# Patient Record
Sex: Male | Born: 1960
Health system: Southern US, Community
[De-identification: ages and names within clinical notes are randomized; demographics above are authoritative.]

## PROBLEM LIST (undated history)

## (undated) DIAGNOSIS — E079 Disorder of thyroid, unspecified: Secondary | ICD-10-CM

## (undated) DIAGNOSIS — F909 Attention-deficit hyperactivity disorder, unspecified type: Secondary | ICD-10-CM

## (undated) DIAGNOSIS — K219 Gastro-esophageal reflux disease without esophagitis: Secondary | ICD-10-CM

## (undated) DIAGNOSIS — E039 Hypothyroidism, unspecified: Secondary | ICD-10-CM

## (undated) DIAGNOSIS — I10 Essential (primary) hypertension: Secondary | ICD-10-CM

## (undated) HISTORY — PX: TONSILLECTOMY: SUR1361

---

## 2004-03-26 ENCOUNTER — Encounter: Payer: Self-pay | Admitting: Family Medicine

## 2004-04-06 ENCOUNTER — Ambulatory Visit (HOSPITAL_COMMUNITY): Admission: RE | Admit: 2004-04-06 | Discharge: 2004-04-06 | Payer: Self-pay | Admitting: Cardiovascular Disease

## 2004-10-25 ENCOUNTER — Ambulatory Visit (HOSPITAL_COMMUNITY): Admission: RE | Admit: 2004-10-25 | Discharge: 2004-10-25 | Payer: Self-pay | Admitting: Cardiovascular Disease

## 2008-06-26 ENCOUNTER — Ambulatory Visit: Payer: Self-pay | Admitting: Family Medicine

## 2008-06-26 LAB — CONVERTED CEMR LAB
Bilirubin Urine: NEGATIVE
Blood in Urine, dipstick: NEGATIVE
Glucose, Urine, Semiquant: NEGATIVE
Ketones, urine, test strip: NEGATIVE
Nitrite: NEGATIVE
Protein, U semiquant: NEGATIVE
Specific Gravity, Urine: 1.01
Urobilinogen, UA: 0.2
WBC Urine, dipstick: NEGATIVE
pH: 7

## 2008-07-01 LAB — CONVERTED CEMR LAB
ALT: 21 units/L (ref 0–53)
AST: 27 units/L (ref 0–37)
Albumin: 4.4 g/dL (ref 3.5–5.2)
Alkaline Phosphatase: 30 units/L — ABNORMAL LOW (ref 39–117)
BUN: 8 mg/dL (ref 6–23)
Basophils Absolute: 0.1 10*3/uL (ref 0.0–0.1)
Basophils Relative: 1.1 % (ref 0.0–3.0)
Bilirubin, Direct: 0 mg/dL (ref 0.0–0.3)
CO2: 29 meq/L (ref 19–32)
Calcium: 9.5 mg/dL (ref 8.4–10.5)
Chloride: 105 meq/L (ref 96–112)
Cholesterol: 240 mg/dL — ABNORMAL HIGH (ref 0–200)
Creatinine, Ser: 0.9 mg/dL (ref 0.4–1.5)
Direct LDL: 141.7 mg/dL
Eosinophils Absolute: 0.3 10*3/uL (ref 0.0–0.7)
Eosinophils Relative: 3.6 % (ref 0.0–5.0)
GFR calc non Af Amer: 96.54 mL/min (ref >60)
Glucose, Bld: 81 mg/dL (ref 70–99)
HCT: 42.6 % (ref 39.0–52.0)
HDL: 54.2 mg/dL (ref >39.00)
Hemoglobin: 14.6 g/dL (ref 13.0–17.0)
Lymphocytes Relative: 31.7 % (ref 12.0–46.0)
Lymphs Abs: 2.3 10*3/uL (ref 0.7–4.0)
MCHC: 34.3 g/dL (ref 30.0–36.0)
MCV: 88.6 fL (ref 78.0–100.0)
Monocytes Absolute: 0.6 10*3/uL (ref 0.1–1.0)
Monocytes Relative: 7.7 % (ref 3.0–12.0)
Neutro Abs: 4.1 10*3/uL (ref 1.4–7.7)
Neutrophils Relative %: 55.9 % (ref 43.0–77.0)
Platelets: 151 10*3/uL (ref 150.0–400.0)
Potassium: 3.9 meq/L (ref 3.5–5.1)
RBC: 4.81 M/uL (ref 4.22–5.81)
RDW: 12 % (ref 11.5–14.6)
Sodium: 139 meq/L (ref 135–145)
TSH: 16.79 microintl units/mL — ABNORMAL HIGH (ref 0.35–5.50)
Total Bilirubin: 1.2 mg/dL (ref 0.3–1.2)
Total CHOL/HDL Ratio: 4
Total Protein: 7.2 g/dL (ref 6.0–8.3)
Triglycerides: 220 mg/dL — ABNORMAL HIGH (ref 0.0–149.0)
VLDL: 44 mg/dL — ABNORMAL HIGH (ref 0.0–40.0)
WBC: 7.4 10*3/uL (ref 4.5–10.5)

## 2008-07-02 ENCOUNTER — Ambulatory Visit: Payer: Self-pay | Admitting: Family Medicine

## 2008-07-02 DIAGNOSIS — E785 Hyperlipidemia, unspecified: Secondary | ICD-10-CM | POA: Insufficient documentation

## 2008-07-02 DIAGNOSIS — E039 Hypothyroidism, unspecified: Secondary | ICD-10-CM | POA: Insufficient documentation

## 2008-07-02 DIAGNOSIS — I10 Essential (primary) hypertension: Secondary | ICD-10-CM | POA: Insufficient documentation

## 2009-07-13 ENCOUNTER — Emergency Department (HOSPITAL_COMMUNITY): Admission: EM | Admit: 2009-07-13 | Discharge: 2009-07-14 | Payer: Self-pay | Admitting: Emergency Medicine

## 2009-07-28 ENCOUNTER — Encounter: Admission: RE | Admit: 2009-07-28 | Discharge: 2009-07-28 | Payer: Self-pay | Admitting: Otolaryngology

## 2009-07-28 ENCOUNTER — Encounter (INDEPENDENT_AMBULATORY_CARE_PROVIDER_SITE_OTHER): Payer: Self-pay | Admitting: *Deleted

## 2009-08-04 ENCOUNTER — Encounter (INDEPENDENT_AMBULATORY_CARE_PROVIDER_SITE_OTHER): Payer: Self-pay | Admitting: *Deleted

## 2009-09-01 ENCOUNTER — Ambulatory Visit: Payer: Self-pay | Admitting: Internal Medicine

## 2009-09-01 DIAGNOSIS — R1319 Other dysphagia: Secondary | ICD-10-CM | POA: Insufficient documentation

## 2009-09-01 DIAGNOSIS — R933 Abnormal findings on diagnostic imaging of other parts of digestive tract: Secondary | ICD-10-CM | POA: Insufficient documentation

## 2009-09-01 DIAGNOSIS — K219 Gastro-esophageal reflux disease without esophagitis: Secondary | ICD-10-CM | POA: Insufficient documentation

## 2009-09-08 ENCOUNTER — Ambulatory Visit: Payer: Self-pay | Admitting: Internal Medicine

## 2009-09-21 ENCOUNTER — Ambulatory Visit: Payer: Self-pay | Admitting: Family Medicine

## 2009-09-25 ENCOUNTER — Ambulatory Visit: Payer: Self-pay | Admitting: Family Medicine

## 2009-09-25 LAB — CONVERTED CEMR LAB
ALT: 18 units/L (ref 0–53)
AST: 24 units/L (ref 0–37)
Albumin: 4.5 g/dL (ref 3.5–5.2)
Alkaline Phosphatase: 38 units/L — ABNORMAL LOW (ref 39–117)
BUN: 14 mg/dL (ref 6–23)
Basophils Absolute: 0 10*3/uL (ref 0.0–0.1)
Basophils Relative: 0.6 % (ref 0.0–3.0)
Bilirubin Urine: NEGATIVE
Bilirubin, Direct: 0.2 mg/dL (ref 0.0–0.3)
Blood in Urine, dipstick: NEGATIVE
CO2: 30 meq/L (ref 19–32)
Calcium: 9.8 mg/dL (ref 8.4–10.5)
Chloride: 108 meq/L (ref 96–112)
Cholesterol: 227 mg/dL — ABNORMAL HIGH (ref 0–200)
Creatinine, Ser: 1 mg/dL (ref 0.4–1.5)
Direct LDL: 139.3 mg/dL
Eosinophils Absolute: 0.3 10*3/uL (ref 0.0–0.7)
Eosinophils Relative: 4.9 % (ref 0.0–5.0)
GFR calc non Af Amer: 86.01 mL/min (ref >60)
Glucose, Bld: 94 mg/dL (ref 70–99)
Glucose, Urine, Semiquant: NEGATIVE
HCT: 42 % (ref 39.0–52.0)
HDL: 69.7 mg/dL (ref >39.00)
Hemoglobin: 14.3 g/dL (ref 13.0–17.0)
Ketones, urine, test strip: NEGATIVE
Lymphocytes Relative: 42.3 % (ref 12.0–46.0)
Lymphs Abs: 2.3 10*3/uL (ref 0.7–4.0)
MCHC: 34 g/dL (ref 30.0–36.0)
MCV: 90.3 fL (ref 78.0–100.0)
Monocytes Absolute: 0.5 10*3/uL (ref 0.1–1.0)
Monocytes Relative: 8.5 % (ref 3.0–12.0)
Neutro Abs: 2.4 10*3/uL (ref 1.4–7.7)
Neutrophils Relative %: 43.7 % (ref 43.0–77.0)
Nitrite: NEGATIVE
Platelets: 163 10*3/uL (ref 150.0–400.0)
Potassium: 5.4 meq/L — ABNORMAL HIGH (ref 3.5–5.1)
Protein, U semiquant: NEGATIVE
RBC: 4.64 M/uL (ref 4.22–5.81)
RDW: 12.8 % (ref 11.5–14.6)
Sodium: 144 meq/L (ref 135–145)
Specific Gravity, Urine: 1.02
TSH: 3.8 microintl units/mL (ref 0.35–5.50)
Total Bilirubin: 1 mg/dL (ref 0.3–1.2)
Total CHOL/HDL Ratio: 3
Total Protein: 7.2 g/dL (ref 6.0–8.3)
Triglycerides: 125 mg/dL (ref 0.0–149.0)
Urobilinogen, UA: 0.2
VLDL: 25 mg/dL (ref 0.0–40.0)
WBC Urine, dipstick: NEGATIVE
WBC: 5.4 10*3/uL (ref 4.5–10.5)
pH: 6

## 2009-10-05 ENCOUNTER — Ambulatory Visit: Payer: Self-pay | Admitting: Family Medicine

## 2010-01-18 ENCOUNTER — Ambulatory Visit (HOSPITAL_COMMUNITY): Admission: RE | Admit: 2010-01-18 | Discharge: 2010-01-18 | Payer: Self-pay | Admitting: Family Medicine

## 2010-05-11 NOTE — Assessment & Plan Note (Signed)
Summary: swollen lymph nodes under both arms/cjr   Vital Signs:  Patient profile:   50 year old male Temp:     98.0 degrees F oral BP sitting:   150 / 100  (left arm) Cuff size:   regular  Vitals Entered By: Sid Falcon LPN (September 21, 2009 4:37 PM)   CC: swollen glands underarm   History of Present Illness: Patient noted about 3 weeks ago some bil axillary "swelling" or prominence. No nodes or distinct masses.  He has not noted any rash, fever, appetite change, weight loss, or any swollen nodes in neck or any other locations.   Recently quit smoking .  No cough or dyspnea.    Allergies (verified): No Known Drug Allergies  Past History:  Past Medical History: Last updated: 07/02/2008 Allergies Hyperlipidemia Hypertension Thyroid problems PMH reviewed for relevance  Review of Systems  The patient denies anorexia, fever, weight loss, hoarseness, chest pain, syncope, dyspnea on exertion, prolonged cough, and hemoptysis.    Physical Exam  General:  Well-developed,well-nourished,in no acute distress; alert,appropriate and cooperative throughout examination Head:  Normocephalic and atraumatic without obvious abnormalities. No apparent alopecia or balding. Ears:  External ear exam shows no significant lesions or deformities.  Otoscopic examination reveals clear canals, tympanic membranes are intact bilaterally without bulging, retraction, inflammation or discharge. Hearing is grossly normal bilaterally. Mouth:  Oral mucosa and oropharynx without lesions or exudates.  Teeth in good repair. Neck:  No deformities, masses, or tenderness noted. Lungs:  Normal respiratory effort, chest expands symmetrically. Lungs are clear to auscultation, no crackles or wheezes. Heart:  Normal rate and regular rhythm. S1 and S2 normal without gallop, murmur, click, rub or other extra sounds. Skin:  no rashes.   Cervical Nodes:  No lymphadenopathy noted Axillary Nodes:  none noted.  No swollen  glands.  No masses palpated.  Slight soft tissue prominence R vs L side.   Impression & Recommendations:  Problem # 1:  LOCALIZED SUPERFICIAL SWELLING MASS OR LUMP (ICD-782.2) no mass or node palpated.  Reassurance given.  Schedule CPE.  Complete Medication List: 1)  Levothyroxine Sodium 100 Mcg Tabs (Levothyroxine sodium) .Marland Kitchen.. 1 by mouth once daily 2)  Fluticasone Propionate 50 Mcg/act Susp (Fluticasone propionate) .Marland Kitchen.. 1-2 sprays per nostril once daily 3)  Dexilant 60 Mg Cpdr (Dexlansoprazole) .... Take 1 tablet by mouth once a day 4)  Bupropion Hcl 150 Mg Xr24h-tab (Bupropion hcl) .... Two tabs daily  Patient Instructions: 1)  Schedule CPE. Prescriptions: LEVOTHYROXINE SODIUM 100 MCG TABS (LEVOTHYROXINE SODIUM) 1 by mouth once daily  #90 x 0   Entered and Authorized by:   Evelena Peat MD   Signed by:   Evelena Peat MD on 09/21/2009   Method used:   [facility] to        [facility]  [address] [phone]       [address] [place], [place]  [address]       Ph: [phone] or [phone]       Fax: [phone]   RxID:   [id number]

## 2010-05-11 NOTE — Assessment & Plan Note (Signed)
Summary: Esophagitis--   History of Present Illness Visit Type: consult Primary GI MD: Yancey Flemings MD Primary Provider: Evelena Peat, M.D. 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 He presents today regarding reflux disease, dysphagia, and an abnormal barium esophagram. Patient tells me that he was a chronic smoker and user of alcohol. In early April he describes problems with hemoptysis, GERD, and dysphagia. He was seen by Dr. Jearld Fenton, ENT, and underwent a laryngoscopy without specific findings. As well, a barium esophagram on April 19 (reviewed). This revealed narrowing of the distal esophagus, possible esophagitis, and transient delay of a 13 mm barium tablet. He is now referred. He had been having intermittent solid food dysphagia as well as reflux symptoms. He was on b.i.d. omeprazole and for the past week once daily Dexilant. On PPI symptoms have improved significantly. He is anxious. He also describes muscle spasm type sensation in the right neck. As well as some transient lymph nodes and the chin and axillary region. His weight has been stable. He has discontinued smoking and alcohol use. GI review of systems is otherwise negative. No family history of gastrointestinal malignancy... His chronic medical problems are stable.   GI Review of Systems    Reports acid reflux, dysphagia with solids, and  heartburn.      Denies abdominal pain, belching, bloating, chest pain, dysphagia with liquids, loss of appetite, nausea, vomiting, vomiting blood, weight loss, and  weight gain.        Denies anal fissure, black tarry stools, change in bowel habit, constipation, diarrhea, diverticulosis, fecal incontinence, heme positive stool, hemorrhoids, irritable bowel syndrome, jaundice, light color stool, liver  problems, rectal bleeding, and  rectal pain. Preventive Screening-Counseling & Management  Alcohol-Tobacco     Smoking Status: quit      Drug Use:  no.      Current Medications (verified): 1)  Levothyroxine Sodium 100 Mcg Tabs (Levothyroxine Sodium) .Marland Kitchen.. 1 By Mouth Once Daily 2)  Fluticasone Propionate 50 Mcg/act Susp (Fluticasone Propionate) .Marland Kitchen.. 1-2 Sprays Per Nostril Once Daily 3)  Dexilant 60 Mg Cpdr (Dexlansoprazole) .... Take 1 Tablet By Mouth Once A Day  Allergies (verified): No Known Drug Allergies  Past History:  Past Medical History: Reviewed history from 07/02/2008 and no changes required. 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  The patient denies allergy/sinus, anemia, anxiety-new, arthritis/joint pain, back pain, blood in urine, breast changes/lumps, change in vision, confusion, cough, coughing up blood, depression-new, fainting, fatigue, fever, headaches-new, hearing problems, heart murmur, heart rhythm changes, itching, muscle pains/cramps, night sweats, nosebleeds, shortness of breath, skin rash, sleeping problems, swelling of feet/legs, thirst - excessive, urination - excessive, urination changes/pain, urine leakage, and voice change.    Vital Signs:  Patient profile:   50 year old male Height:      73 inches Weight:      186 pounds BMI:     24.63 Pulse rate:   80 / minute Pulse rhythm:   regular BP sitting:   140 /  86  (left arm) Cuff size:   regular  Vitals Entered By: Francee Piccolo CMA Duncan Dull) (Sep 01, 2009 2:33 PM)  Physical Exam  General:  Well developed, well nourished, no  acute distress. Head:  Normocephalic and atraumatic. Eyes:  PERRLA, no icterus. Ears:  Normal auditory acuity. Nose:  No deformity, discharge,  or lesions. Mouth:  No deformity or lesions, dentition normal. Neck:  Supple; no masses or thyromegaly. Chest Wall:  Symmetrical,  no deformities . Lungs:  Clear throughout to auscultation. Heart:  Regular rate and rhythm; no murmurs, rubs,  or bruits. Abdomen:  Soft, nontender and nondistended. No masses, hepatosplenomegaly or hernias noted. Normal bowel sounds. Msk:  Symmetrical with no gross deformities. Normal posture. Pulses:  Normal pulses noted. Extremities:  No clubbing, cyanosis, edema or deformities noted. Neurologic:  Alert and  oriented x4;  grossly normal neurologically. Skin:  Intact without significant lesions or rashes. Cervical Nodes:  no cervical or supraclavicular adenopathy Axillary Nodes:  No significant axillary adenopathy. Psych:  Alert and cooperative. Normal mood and affect.   Impression & Recommendations:  Problem # 1:  GERD (ICD-530.81) GERD with classic symptoms and problems with dysphagia. Improvement on PPI.  Plan: #1. Continue omeprazole 40 mg daily  #2. Reflux precautions #3. Literature on GERD provided for his review  Problem # 2:  DYSPHAGIA (ICD-787.29) intermittent dysphagia likely due to esophageal edema from esophagitis secondary to GERD. Possible underlying peptic stricture.  Plan: #1. Upper endoscopy to evaluate dysphagia. The nature of the procedure as well as the risks, benefits, and alternatives have been reviewed. He understood and agreed to proceed. #2. Consider esophageal dilation for significant stricture or dysphagia refractory to PPI therapy  Problem # 3:  ABNORMAL FINDINGS GI TRACT (ICD-793.4) changes on the esophagram consistent with esophagitis, subtle stricture. No evidence of cancer. Will clarify with upper endoscopy  Other Orders: EGD (EGD)  Patient Instructions: 1)  EGD [facility]  09/08/09 4:00 pm 2)  Upper Endoscopy brochure given.  3)  Copy sent to : Suzanna Obey, MD, Dr. Evelena Peat 4)  The medication list was reviewed and reconciled.  All changed / newly prescribed medications were explained.  A complete medication list was provided to the patient / caregiver. 5)  printed and given to pt. Milford Cage NCMA  Sep 01, 2009 3:17 PM

## 2010-05-11 NOTE — Letter (Signed)
Summary: New Patient letter  [facility]  [address] [place], [place] [address]   Phone: [phone]  Fax: [phone]       08/04/2009 MRN: [id number]  [facility] [address] [place], [place]  [address]  Dear Donald Osborne,  Welcome to the Gastroenterology Division at [facility].    You are scheduled to see Dr. Marina Goodell on 09-01-09 at 2:30p.m. on the 3rd floor at [facility], [address]  We ask that you try to arrive at our office 15 minutes prior to your appointment time to allow for check-in.  We would like you to complete the enclosed self-administered evaluation form prior to your visit and bring it with you on the day of your appointment.  We will review it with you.  Also, please bring a complete list of all your medications or, if you prefer, bring the medication bottles and we will list them.  Please bring your insurance card so that we may make a copy of it.  If your insurance requires a referral to see a specialist, please bring your referral form from your primary care physician.  Co-payments are due at the time of your visit and may be paid by cash, check or credit card.     Your office visit will consist of a consult with your physician (includes a physical exam), any laboratory testing he/she may order, scheduling of any necessary diagnostic testing (e.g. x-ray, ultrasound, CT-scan), and scheduling of a procedure (e.g. Endoscopy, Colonoscopy) if required.  Please allow enough time on your schedule to allow for any/all of these possibilities.    If you cannot keep your appointment, please call [phone] to cancel or reschedule prior to your appointment date.  This allows [place] the opportunity to schedule an appointment for another patient in need of care.  If you do not cancel or reschedule by 5 p.m. the business day prior to your appointment date, you will be charged a $50.00 late cancellation/no-show fee.    Thank you for choosing [facility] for your medical needs.  We appreciate the opportunity to care for you.  Please visit [place] at our website  to learn more about our practice.                     Sincerely,                                                             The Gastroenterology Division

## 2010-05-11 NOTE — Procedures (Signed)
Summary: Upper Endoscopy  Patient: Donald Osborne Note: All result statuses are Final unless otherwise noted.  Tests: (1) Upper Endoscopy (EGD)   EGD Upper Endoscopy       DONE     [facility]     [address]     [place], [place]  [address]           ENDOSCOPY PROCEDURE REPORT           PATIENT:  Donald Osborne, Donald Osborne  MR#:  [id number]     BIRTHDATE:  1960/04/26, 49 yrs. old  GENDER:  male           ENDOSCOPIST:  Daleon Willinger. Eda Keys, MD     Referred by:  Suzanna Obey, M.D.           PROCEDURE DATE:  09/08/2009     PROCEDURE:  EGD, diagnostic,     Maloney Dilation of Esophagus - 65     F     ASA CLASS:  Class II     INDICATIONS:  dysphagia, GERD, abnormal imaging           MEDICATIONS:   Fentanyl 100 mcg IV, Versed 10 mg IV     TOPICAL ANESTHETIC:  Exactacain Spray           DESCRIPTION OF PROCEDURE:   After the risks benefits and     alternatives of the procedure were thoroughly explained, informed     consent was obtained.  The LB GIF-H180 [id number] endoscope was     introduced through the mouth and advanced to the second portion of     the duodenum, without limitations.  The instrument was slowly     withdrawn as the mucosa was fully examined.     <<PROCEDUREIMAGES>>           The upper and middle third of the esophagus were carefully     inspected and no abnormalities were noted.there was a subtle     benign stricture at the GE junction. The z-line was well seen at     the GEJ.No Barrett's. The endoscope was pushed into the fundus     which was normal including a retroflexed view. The antrum,gastric     body, first and second part of the duodenum were unremarkable.     Retroflexed views revealed no abnormalities.    The scope was then     withdrawn from the patient and the procedure completed.           THERAPY: 56 F MALONEY DILATOR PASSED W/O RESITANCE OR HEME.     TOLERATED WELL           COMPLICATIONS:  None           ENDOSCOPIC IMPRESSION:     1) Subtle Stricture in  the distal esophagus - s/p Maloney     dilation 71F     2) Normal EGD otherwise     3) Gerd     RECOMMENDATIONS:     1) continue PPI daily     2) Anti-reflux regimen to be follow     3) OP follow-up in 6 weeks.           ______________________________     Wilhemina Bonito. Eda Keys, MD           CC:  Evelena Peat, MD, Suzanna Obey, MD, The Patient           n.     eSIGNED:  Wilhemina Bonito. Eda Keys at 09/08/2009 04:17 PM           Georgiann Cocker, [id number]  Note: An exclamation mark (!) indicates a result that was not dispersed into the flowsheet. Document Creation Date: 09/08/2009 4:17 PM _______________________________________________________________________  (1) Order result status: Final Collection or observation date-time: 09/08/2009 16:10 Requested date-time:  Receipt date-time:  Reported date-time:  Referring Physician:   Ordering Physician: Fransico Setters [id number]) Specimen Source:  Source: Launa Grill Order Number: [id number] Lab site:

## 2010-05-11 NOTE — Letter (Signed)
Summary: EGD Instructions  [facility]  [address] [place], [place] [address]   Phone: [phone]  Fax: [phone]       Donald Osborne    1960-07-27    MRN: [id number]       Procedure Day /Date:TUESDAY, 09/08/09     Arrival Time: 3:00 PM     Procedure Time:4:00 PM     [facility] of Procedure:                    [facility] (4th [facility]  PREPARATION FOR ENDOSCOPY   OnTUESDAY, 09/08/09 THE DAY OF THE PROCEDURE:  1.   No solid foods, milk or milk products are allowed after midnight the night before your procedure.  2.   Do not drink anything colored red or purple.  Avoid juices with pulp.  No orange juice.  3.  You may drink clear liquids until2:00 PM which is 2 hours before your procedure.                                                                                                CLEAR LIQUIDS INCLUDE: Water Jello Ice Popsicles Tea (sugar ok, no milk/cream) Powdered fruit flavored drinks Coffee (sugar ok, no milk/cream) Gatorade Juice: apple, Hartzell grape, Greenstein cranberry  Lemonade Clear bullion, consomm, broth Carbonated beverages (any kind) Strained chicken noodle soup Hard Candy   MEDICATION INSTRUCTIONS  Unless otherwise instructed, you should take regular prescription medications with a small sip of water as early as possible the morning of your procedure.             OTHER INSTRUCTIONS  You will need a responsible adult at least 48 years of age to accompany you and drive you home.   This person must remain in the waiting room during your procedure.  Wear loose fitting clothing that is easily removed.  Leave jewelry and other valuables at home.  However, you may wish to bring a book to read or an iPod/MP3 player to listen to music as you wait for your procedure to start.  Remove all body piercing jewelry and leave at home.  Total time from sign-in until discharge is approximately 2-3 hours.  You should go home directly  after your procedure and rest.  You can resume normal activities the day after your procedure.  The day of your procedure you should not:   Drive   Make legal decisions   Operate machinery   Drink alcohol   Return to work  You will receive specific instructions about eating, activities and medications before you leave.    The above instructions have been reviewed and explained to me by   _______________________    I fully understand and can verbalize these instructions _____________________________ Date _________

## 2010-05-11 NOTE — Assessment & Plan Note (Signed)
Summary: cpx/njr/Resched/cb   Vital Signs:  Patient profile:   50 year old male Height:      73 inches Weight:      184.5 pounds Temp:     98 degrees F oral Pulse rate:   80 / minute Pulse rhythm:   regular Resp:     12 per minute BP sitting:   130 / 84  (left arm) Cuff size:   regular  Vitals Entered By: Sid Falcon LPN (October 05, 2009 2:07 PM) CC: CPX   History of Present Illness: Here for CPE.  REcently quit smoking and ETOH use and has remained compliant. Starting to exercise.   Last tetanus > 10 years ago.  PMH, SH , AND FH reviewed.  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  Review of Systems  The patient denies anorexia, fever, weight loss, weight gain, chest pain, syncope, dyspnea on exertion, peripheral edema, prolonged cough, headaches, hemoptysis, abdominal pain, melena, hematochezia, severe indigestion/heartburn, incontinence, genital sores, muscle weakness, enlarged lymph nodes, and testicular masses.    Physical Exam  General:  Well-developed,well-nourished,in no acute distress; alert,appropriate and cooperative throughout examination Head:  Normocephalic and atraumatic without obvious  abnormalities. No apparent alopecia or balding. Eyes:  No corneal or conjunctival inflammation noted. EOMI. Perrla. Funduscopic exam benign, without hemorrhages, exudates or papilledema. Vision grossly normal. Ears:  External ear exam shows no significant lesions or deformities.  Otoscopic examination reveals clear canals, tympanic membranes are intact bilaterally without bulging, retraction, inflammation or discharge. Hearing is grossly normal bilaterally. Mouth:  Oral mucosa and oropharynx without lesions or exudates.  Teeth in good repair. Neck:  No deformities, masses, or tenderness noted. Lungs:  Normal respiratory effort, chest expands symmetrically. Lungs are clear to auscultation, no crackles or wheezes. Heart:  Normal rate and regular rhythm. S1 and S2 normal without gallop, murmur, click, rub or other extra sounds. Abdomen:  Bowel sounds positive,abdomen soft and non-tender without masses, organomegaly or hernias noted. Msk:  No deformity or scoliosis noted of thoracic or lumbar spine.   Extremities:  No clubbing, cyanosis, edema, or deformity noted with normal full range of motion of all joints.   Neurologic:  alert & oriented X3, cranial nerves II-XII intact, and strength normal in all extremities.   Skin:  Intact without suspicious lesions or rashes Cervical Nodes:  No lymphadenopathy noted Psych:  normally interactive, good eye contact, not anxious appearing, and not depressed appearing.     Impression & Recommendations:  Problem # 1:  PHYSICAL EXAMINATION (ICD-V70.0) Tdap given .  Labs reviewed with pt.  Discussed ongoing abstinence from ETOH and nicotine.  Complete Medication List: 1)  Levothyroxine Sodium 100 Mcg Tabs (Levothyroxine sodium) .Marland Kitchen.. 1 by mouth once daily 2)  Fluticasone Propionate 50 Mcg/act Susp (Fluticasone propionate) .Marland Kitchen.. 1-2 sprays per nostril once daily 3)  Dexilant 60 Mg Cpdr (Dexlansoprazole) .... Take  1 tablet by mouth once a day 4)  Bupropion Hcl 150  Mg Xr24h-tab (Bupropion hcl) .... Two tabs daily  Other Orders: Tdap => 91yrs IM (61607) Admin 1st Vaccine (37106)  Patient Instructions: 1)  It is important that you exercise reguarly at least 20 minutes 5 times a week. If you develop chest pain, have severe difficulty breathing, or feel very tired, stop exercising immediately and seek medical attention.  Prescriptions: FLUTICASONE PROPIONATE 50 MCG/ACT SUSP (FLUTICASONE PROPIONATE) 1-2 sprays per nostril once daily  #1 x 11   Entered and Authorized by:   Evelena Peat MD   Signed by:   Evelena Peat MD on 10/05/2009   Method used:   [facility]  [address] [phone][facility]ail[facility]       [address] [place], [place]  [address]       Ph: [phone] or [phone]       Fax: [phone]   RxID:   [id number]     Immunizations Administered:  Tetanus Vaccine:    Vaccine Type: Tdap    Site: left deltoid    Mfr: GlaxoSmithKline    Dose: 0.5 ml    Route: IM    Given by: Sid Falcon LPN    Exp. Date: 06/10/2011    Lot #: [id number]

## 2010-06-30 LAB — CBC
HCT: 39.4 % (ref 39.0–52.0)
Hemoglobin: 13.7 g/dL (ref 13.0–17.0)
MCHC: 34.8 g/dL (ref 30.0–36.0)
MCV: 89.7 fL (ref 78.0–100.0)
Platelets: 167 10*3/uL (ref 150–400)
RBC: 4.4 MIL/uL (ref 4.22–5.81)
RDW: 12.7 % (ref 11.5–15.5)
WBC: 6.2 10*3/uL (ref 4.0–10.5)

## 2010-06-30 LAB — BASIC METABOLIC PANEL
BUN: 12 mg/dL (ref 6–23)
CO2: 22 mEq/L (ref 19–32)
Calcium: 8.7 mg/dL (ref 8.4–10.5)
Chloride: 104 mEq/L (ref 96–112)
Creatinine, Ser: 1.09 mg/dL (ref 0.4–1.5)
GFR calc Af Amer: >60 mL/min (ref >60)
GFR calc non Af Amer: >60 mL/min (ref >60)
Glucose, Bld: 115 mg/dL — ABNORMAL HIGH (ref 70–99)
Potassium: 3.5 mEq/L (ref 3.5–5.1)
Sodium: 134 mEq/L — ABNORMAL LOW (ref 135–145)

## 2010-06-30 LAB — DIFFERENTIAL
Basophils Absolute: 0 10*3/uL (ref 0.0–0.1)
Basophils Relative: 1 % (ref 0–1)
Eosinophils Absolute: 0.2 10*3/uL (ref 0.0–0.7)
Eosinophils Relative: 4 % (ref 0–5)
Lymphocytes Relative: 39 % (ref 12–46)
Lymphs Abs: 2.4 10*3/uL (ref 0.7–4.0)
Monocytes Absolute: 0.6 10*3/uL (ref 0.1–1.0)
Monocytes Relative: 10 % (ref 3–12)
Neutro Abs: 2.9 10*3/uL (ref 1.7–7.7)
Neutrophils Relative %: 47 % (ref 43–77)

## 2012-09-07 ENCOUNTER — Other Ambulatory Visit: Payer: Self-pay

## 2012-09-07 DIAGNOSIS — R140 Abdominal distension (gaseous): Secondary | ICD-10-CM

## 2012-09-11 ENCOUNTER — Ambulatory Visit
Admission: RE | Admit: 2012-09-11 | Discharge: 2012-09-11 | Disposition: A | Discharge status: Home / Self Care | Payer: BC Managed Care – PPO | Source: Ambulatory Visit

## 2012-09-11 DIAGNOSIS — R140 Abdominal distension (gaseous): Secondary | ICD-10-CM

## 2015-01-02 ENCOUNTER — Other Ambulatory Visit: Payer: Self-pay | Admitting: Physician Assistant

## 2015-01-02 DIAGNOSIS — R109 Unspecified abdominal pain: Secondary | ICD-10-CM

## 2015-01-07 ENCOUNTER — Other Ambulatory Visit: Payer: Self-pay

## 2015-01-16 ENCOUNTER — Ambulatory Visit
Admission: RE | Admit: 2015-01-16 | Discharge: 2015-01-16 | Disposition: A | Discharge status: Home / Self Care | Payer: BLUE CROSS/BLUE SHIELD | Source: Ambulatory Visit | Attending: Physician Assistant | Admitting: Physician Assistant

## 2015-01-16 DIAGNOSIS — R109 Unspecified abdominal pain: Secondary | ICD-10-CM

## 2016-01-18 DIAGNOSIS — L719 Rosacea, unspecified: Secondary | ICD-10-CM | POA: Diagnosis not present

## 2016-01-18 DIAGNOSIS — Z23 Encounter for immunization: Secondary | ICD-10-CM | POA: Diagnosis not present

## 2016-04-07 DIAGNOSIS — N529 Male erectile dysfunction, unspecified: Secondary | ICD-10-CM | POA: Diagnosis not present

## 2016-04-07 DIAGNOSIS — E039 Hypothyroidism, unspecified: Secondary | ICD-10-CM | POA: Diagnosis not present

## 2016-04-07 DIAGNOSIS — F900 Attention-deficit hyperactivity disorder, predominantly inattentive type: Secondary | ICD-10-CM | POA: Diagnosis not present

## 2016-05-11 DIAGNOSIS — L719 Rosacea, unspecified: Secondary | ICD-10-CM | POA: Diagnosis not present

## 2016-10-28 DIAGNOSIS — K219 Gastro-esophageal reflux disease without esophagitis: Secondary | ICD-10-CM | POA: Diagnosis not present

## 2016-10-28 DIAGNOSIS — F900 Attention-deficit hyperactivity disorder, predominantly inattentive type: Secondary | ICD-10-CM | POA: Diagnosis not present

## 2016-10-28 DIAGNOSIS — E039 Hypothyroidism, unspecified: Secondary | ICD-10-CM | POA: Diagnosis not present

## 2016-12-29 ENCOUNTER — Encounter: Payer: Self-pay | Admitting: Family Medicine

## 2017-01-11 DIAGNOSIS — Z23 Encounter for immunization: Secondary | ICD-10-CM | POA: Diagnosis not present

## 2017-03-20 ENCOUNTER — Encounter (HOSPITAL_COMMUNITY): Payer: Self-pay | Admitting: Physician Assistant

## 2017-03-20 ENCOUNTER — Other Ambulatory Visit: Payer: Self-pay

## 2017-03-20 ENCOUNTER — Emergency Department (HOSPITAL_COMMUNITY)
Admission: EM | Admit: 2017-03-20 | Discharge: 2017-03-20 | Disposition: A | Discharge status: Home / Self Care | Payer: BLUE CROSS/BLUE SHIELD | Attending: Emergency Medicine | Admitting: Emergency Medicine

## 2017-03-20 DIAGNOSIS — E039 Hypothyroidism, unspecified: Secondary | ICD-10-CM | POA: Insufficient documentation

## 2017-03-20 DIAGNOSIS — R1084 Generalized abdominal pain: Secondary | ICD-10-CM

## 2017-03-20 DIAGNOSIS — I10 Essential (primary) hypertension: Secondary | ICD-10-CM | POA: Diagnosis not present

## 2017-03-20 DIAGNOSIS — R1033 Periumbilical pain: Secondary | ICD-10-CM | POA: Diagnosis not present

## 2017-03-20 LAB — LIPASE, BLOOD: LIPASE: 48 U/L (ref 11–51)

## 2017-03-20 LAB — COMPREHENSIVE METABOLIC PANEL
ALBUMIN: 4.5 g/dL (ref 3.5–5.0)
ALT: 21 U/L (ref 17–63)
AST: 28 U/L (ref 15–41)
Alkaline Phosphatase: 39 U/L (ref 38–126)
Anion gap: 10 (ref 5–15)
BUN: 7 mg/dL (ref 6–20)
CHLORIDE: 100 mmol/L — AB (ref 101–111)
CO2: 25 mmol/L (ref 22–32)
CREATININE: 1.02 mg/dL (ref 0.61–1.24)
Calcium: 9.7 mg/dL (ref 8.9–10.3)
GFR calc non Af Amer: >60 mL/min (ref >60)
Glucose, Bld: 119 mg/dL — ABNORMAL HIGH (ref 65–99)
Potassium: 4.8 mmol/L (ref 3.5–5.1)
SODIUM: 135 mmol/L (ref 135–145)
Total Bilirubin: 1 mg/dL (ref 0.3–1.2)
Total Protein: 7.2 g/dL (ref 6.5–8.1)

## 2017-03-20 LAB — URINALYSIS, ROUTINE W REFLEX MICROSCOPIC
Bilirubin Urine: NEGATIVE
GLUCOSE, UA: NEGATIVE mg/dL
HGB URINE DIPSTICK: NEGATIVE
Ketones, ur: NEGATIVE mg/dL
Leukocytes, UA: NEGATIVE
Nitrite: NEGATIVE
PROTEIN: NEGATIVE mg/dL
Specific Gravity, Urine: 1.005 (ref 1.005–1.030)
pH: 7 (ref 5.0–8.0)

## 2017-03-20 LAB — CBC
HCT: 45.4 % (ref 39.0–52.0)
Hemoglobin: 15.7 g/dL (ref 13.0–17.0)
MCH: 31 pg (ref 26.0–34.0)
MCHC: 34.6 g/dL (ref 30.0–36.0)
MCV: 89.5 fL (ref 78.0–100.0)
PLATELETS: 210 10*3/uL (ref 150–400)
RBC: 5.07 MIL/uL (ref 4.22–5.81)
RDW: 12.8 % (ref 11.5–15.5)
WBC: 7.1 10*3/uL (ref 4.0–10.5)

## 2017-03-20 MED ORDER — SUCRALFATE 1 G PO TABS: 1.0000 g | ORAL_TABLET | Freq: Three times a day (TID) | ORAL | 0 refills | Status: DC

## 2017-03-20 MED ORDER — RANITIDINE HCL 150 MG PO TABS: 150.0000 mg | ORAL_TABLET | Freq: Two times a day (BID) | ORAL | 0 refills | Status: DC

## 2017-03-20 NOTE — Discharge Instructions (Signed)
1. Medications: Start taking zantac and carafate. Stop prilosec.  2. Treatment: rest, drink plenty of fluids, advance diet slowly and eat bland foods that will not upset your stomach. Elevate the head of the bed.  3. Follow Up: Please followup with your primary doctor in 5-7 days for discussion of your diagnoses and further evaluation after today's visit; Please return to the ER for persistent vomiting, high fevers or worsening symptoms

## 2017-03-20 NOTE — ED Provider Notes (Signed)
[facility]   CSN: [id number] Arrival date & time: 03/20/17  1231     History   Chief Complaint Chief Complaint  Patient presents with  . Abdominal Pain    HPI Georgiann CockerJohn Mesquita is a 56 y.o. male with history of GERD, hypothyroidism, HLD, HTN presents today with chief complaint acute onset intermittent abdominal pain for 1 week.  He notes a mild dull burning sensation to the periumbilical region with radiation to the right side of the upper abdomen for the past week which comes on after meals.  He also notes a sensation of bloating.  Denies fevers, chills, chest pain, shortness of breath, nausea, vomiting, diarrhea, constipation, melena, or hematochezia.  He denies urinary symptoms.  No known sick contacts.  He notes that he is a former smoker and has cut down alcohol consumption significantly drinking socially a few times a month.  Notes he has a history of GERD for which he takes Prilosec which has been somewhat helpful for his symptoms. He had an appointment with his PCP today but it was canceled due to inclement weather.   The history is provided by the patient.    History reviewed. No pertinent past medical history.  Patient Active Problem List   Diagnosis Date Noted  . GERD 09/01/2009  . DYSPHAGIA 09/01/2009  . ABNORMAL FINDINGS GI TRACT 09/01/2009  . HYPOTHYROIDISM 07/02/2008  . HYPERLIPIDEMIA 07/02/2008  . HYPERTENSION 07/02/2008      Home Medications    Prior to Admission medications   Medication Sig Start Date End Date Taking? Authorizing Provider  ranitidine (ZANTAC) 150 MG tablet Take 1 tablet (150 mg total) by mouth 2 (two) times daily. 03/20/17   Luevenia MaxinFawze, Merranda Bolls A, PA-C  sucralfate (CARAFATE) 1 g tablet Take 1 tablet (1 g total) by mouth 4 (four) times daily -  with meals and at bedtime for 14 days. 03/20/17 04/03/17  Jeanie SewerFawze, Shawniece Oyola A, PA-C    Family History No family history on file.  Social History Social History     Tobacco Use  . Smoking status: Not on file  Substance Use Topics  . Alcohol use: Not on file  . Drug use: Not on file     Allergies   Patient has no known allergies.   Review of Systems Review of Systems  Constitutional: Negative for chills and fever.  Respiratory: Negative for shortness of breath.   Cardiovascular: Negative for chest pain.  Gastrointestinal: Positive for abdominal pain. Negative for blood in stool, constipation, diarrhea, nausea, rectal pain and vomiting.  Genitourinary: Negative for dysuria and hematuria.  All other systems reviewed and are negative.    Physical Exam Updated Vital Signs BP (!) 153/110 (BP Location: Right Arm)   Pulse 78   Temp 97.8 F (36.6 C) (Oral)   Resp 16   SpO2 98%   Physical Exam  Constitutional: He appears well-developed and well-nourished. No distress.  HENT:  Head: Normocephalic and atraumatic.  Eyes: Conjunctivae and EOM are normal. Pupils are equal, round, and reactive to light. Right eye exhibits no discharge. Left eye exhibits no discharge.  Neck: No JVD present. No tracheal deviation present.  Cardiovascular: Normal rate, regular rhythm and normal heart sounds.  Pulmonary/Chest: Effort normal and breath sounds normal.  Abdominal: Soft. Normal appearance and bowel sounds are normal. He exhibits no distension, no pulsatile liver, no fluid wave, no ascites, no pulsatile midline mass and no mass. There is no tenderness. There is no rigidity, no  rebound, no guarding, no CVA tenderness, no tenderness at McBurney's point and negative Murphy's sign.  Musculoskeletal: He exhibits no edema.  Neurological: He is alert.  Skin: Skin is warm and dry. No erythema.  Psychiatric: He has a normal mood and affect. His behavior is normal.  Nursing note and vitals reviewed.    ED Treatments / Results  Labs (all labs ordered are listed, but only abnormal results are displayed) Labs Reviewed  COMPREHENSIVE METABOLIC PANEL -  Abnormal; Notable for the following components:      Result Value   Chloride 100 (*)    Glucose, Bld 119 (*)    All other components within normal limits  LIPASE, BLOOD  CBC  URINALYSIS, ROUTINE W REFLEX MICROSCOPIC    EKG  EKG Interpretation None       Radiology No results found.  Procedures Procedures (including critical care time)  Medications Ordered in ED Medications - No data to display   Initial Impression / Assessment and Plan / ED Course  I have reviewed the triage vital signs and the nursing notes.  Pertinent labs & imaging results that were available during my care of the patient were reviewed by me and considered in my medical decision making (see chart for details).     Patient presents with burning abdominal pain after meals.  Afebrile, vital signs are stable (states Bouley coat hypertension) and he is nontoxic in appearance.  Abdominal examination is unremarkable.  Lab work is unremarkable.  No evidence of UTI or nephrolithiasis.  No leukocytosis.  Doubt obstruction, perforation, appendicitis, mesenteric ischemia, or other acute surgical abdominal pathology. Suspect worsening of his GERD or food sensitivity such as lactose intolerance or gluten intolerance.   He is tolerating p.o. food and fluids without difficulty.  He will follow-up with primary care physician for reevaluation.  Discussed lifestyle modification such as avoiding alcohol and elevating the head of the bed.  He will also follow-up with his primary care physician for blood pressure recheck.  Will start Carafate and Zantac and discontinue Prilosec.  Discussed indications for return to the ED. Pt verbalized understanding of and agreement with plan and is safe for discharge home at this time.   Final Clinical Impressions(s) / ED Diagnoses   Final diagnoses:  Generalized abdominal pain    ED Discharge Orders        Ordered    ranitidine (ZANTAC) 150 MG tablet  2 times daily     03/20/17 1403     sucralfate (CARAFATE) 1 g tablet  3 times daily with meals & bedtime     03/20/17 1403       Jeanie SewerFawze, Leonora Gores A, PA-C 03/20/17 1409    Benjiman CorePickering, Nathan, MD 03/20/17 1529

## 2017-03-20 NOTE — ED Notes (Signed)
EDP at bedside  

## 2017-03-20 NOTE — ED Triage Notes (Addendum)
Pt reports mid abd pain for 1 week with bloating and feels like he has fluid in his abdomen. Worse after eating. Pt denies any n/v/d.

## 2017-03-20 NOTE — ED Notes (Signed)
Patient given discharge instructions and verbalized understanding.  Patient stable to discharge at this time.  Patient is alert and oriented to baseline.  No distressed noted at this time.  All belongings taken with the patient at discharge.   

## 2017-03-23 DIAGNOSIS — R140 Abdominal distension (gaseous): Secondary | ICD-10-CM | POA: Diagnosis not present

## 2017-03-23 DIAGNOSIS — E039 Hypothyroidism, unspecified: Secondary | ICD-10-CM | POA: Diagnosis not present

## 2017-03-28 ENCOUNTER — Other Ambulatory Visit: Payer: Self-pay | Admitting: Family Medicine

## 2017-03-28 DIAGNOSIS — R140 Abdominal distension (gaseous): Secondary | ICD-10-CM

## 2017-03-29 DIAGNOSIS — K219 Gastro-esophageal reflux disease without esophagitis: Secondary | ICD-10-CM | POA: Diagnosis not present

## 2017-03-29 DIAGNOSIS — R1011 Right upper quadrant pain: Secondary | ICD-10-CM | POA: Diagnosis not present

## 2017-03-29 DIAGNOSIS — F1021 Alcohol dependence, in remission: Secondary | ICD-10-CM | POA: Diagnosis not present

## 2017-03-30 ENCOUNTER — Ambulatory Visit
Admission: RE | Admit: 2017-03-30 | Discharge: 2017-03-30 | Disposition: A | Discharge status: Home / Self Care | Payer: BLUE CROSS/BLUE SHIELD | Source: Ambulatory Visit | Attending: Family Medicine | Admitting: Family Medicine

## 2017-03-30 DIAGNOSIS — R1010 Upper abdominal pain, unspecified: Secondary | ICD-10-CM | POA: Diagnosis not present

## 2017-03-30 DIAGNOSIS — R140 Abdominal distension (gaseous): Secondary | ICD-10-CM

## 2017-04-07 ENCOUNTER — Other Ambulatory Visit: Payer: Self-pay | Admitting: Family Medicine

## 2017-04-10 ENCOUNTER — Other Ambulatory Visit: Payer: Self-pay | Admitting: Family Medicine

## 2017-04-10 DIAGNOSIS — K838 Other specified diseases of biliary tract: Secondary | ICD-10-CM

## 2017-04-13 DIAGNOSIS — R202 Paresthesia of skin: Secondary | ICD-10-CM | POA: Diagnosis not present

## 2017-04-13 DIAGNOSIS — A510 Primary genital syphilis: Secondary | ICD-10-CM | POA: Diagnosis not present

## 2017-04-16 ENCOUNTER — Other Ambulatory Visit: Payer: BLUE CROSS/BLUE SHIELD

## 2017-04-23 ENCOUNTER — Inpatient Hospital Stay
Admission: RE | Admit: 2017-04-23 | Discharge: 2017-04-23 | Disposition: A | Discharge status: Home / Self Care | Payer: BLUE CROSS/BLUE SHIELD | Source: Ambulatory Visit | Attending: Family Medicine | Admitting: Family Medicine

## 2017-04-29 ENCOUNTER — Ambulatory Visit
Admission: RE | Admit: 2017-04-29 | Discharge: 2017-04-29 | Disposition: A | Discharge status: Home / Self Care | Payer: BLUE CROSS/BLUE SHIELD | Source: Ambulatory Visit | Attending: Family Medicine | Admitting: Family Medicine

## 2017-04-29 DIAGNOSIS — D1803 Hemangioma of intra-abdominal structures: Secondary | ICD-10-CM | POA: Diagnosis not present

## 2017-04-29 DIAGNOSIS — K838 Other specified diseases of biliary tract: Secondary | ICD-10-CM

## 2017-04-29 MED ORDER — GADOBENATE DIMEGLUMINE 529 MG/ML IV SOLN
15.0000 mL | Freq: Once | INTRAVENOUS | Status: AC | PRN
  Administered 2017-04-29: 15 mL via INTRAVENOUS

## 2017-05-10 DIAGNOSIS — K210 Gastro-esophageal reflux disease with esophagitis: Secondary | ICD-10-CM | POA: Diagnosis not present

## 2017-05-10 DIAGNOSIS — K449 Diaphragmatic hernia without obstruction or gangrene: Secondary | ICD-10-CM | POA: Diagnosis not present

## 2017-05-10 DIAGNOSIS — K219 Gastro-esophageal reflux disease without esophagitis: Secondary | ICD-10-CM | POA: Diagnosis not present

## 2017-05-16 DIAGNOSIS — K210 Gastro-esophageal reflux disease with esophagitis: Secondary | ICD-10-CM | POA: Diagnosis not present

## 2017-06-20 DIAGNOSIS — M5430 Sciatica, unspecified side: Secondary | ICD-10-CM | POA: Diagnosis not present

## 2017-07-19 DIAGNOSIS — M545 Low back pain: Secondary | ICD-10-CM | POA: Diagnosis not present

## 2017-07-19 DIAGNOSIS — M5441 Lumbago with sciatica, right side: Secondary | ICD-10-CM | POA: Diagnosis not present

## 2017-07-25 DIAGNOSIS — M545 Low back pain: Secondary | ICD-10-CM | POA: Diagnosis not present

## 2017-07-25 DIAGNOSIS — M5416 Radiculopathy, lumbar region: Secondary | ICD-10-CM | POA: Diagnosis not present

## 2017-08-01 DIAGNOSIS — M5416 Radiculopathy, lumbar region: Secondary | ICD-10-CM | POA: Diagnosis not present

## 2017-08-08 DIAGNOSIS — M5416 Radiculopathy, lumbar region: Secondary | ICD-10-CM | POA: Diagnosis not present

## 2017-08-15 DIAGNOSIS — M5416 Radiculopathy, lumbar region: Secondary | ICD-10-CM | POA: Diagnosis not present

## 2017-09-26 DIAGNOSIS — K648 Other hemorrhoids: Secondary | ICD-10-CM | POA: Diagnosis not present

## 2017-09-27 ENCOUNTER — Emergency Department (HOSPITAL_COMMUNITY)
Admission: EM | Admit: 2017-09-27 | Discharge: 2017-09-27 | Disposition: A | Discharge status: Home / Self Care | Payer: BLUE CROSS/BLUE SHIELD | Source: Home / Self Care

## 2017-09-27 ENCOUNTER — Emergency Department (HOSPITAL_COMMUNITY): Payer: BLUE CROSS/BLUE SHIELD

## 2017-09-27 ENCOUNTER — Other Ambulatory Visit: Payer: Self-pay

## 2017-09-27 ENCOUNTER — Encounter (HOSPITAL_COMMUNITY): Payer: Self-pay | Admitting: Emergency Medicine

## 2017-09-27 ENCOUNTER — Encounter (HOSPITAL_COMMUNITY): Payer: Self-pay

## 2017-09-27 ENCOUNTER — Observation Stay (HOSPITAL_COMMUNITY)
Admission: EM | Admit: 2017-09-27 | Discharge: 2017-09-29 | Disposition: A | Discharge status: Home / Self Care | Payer: BLUE CROSS/BLUE SHIELD | Attending: Surgery | Admitting: Surgery

## 2017-09-27 DIAGNOSIS — K611 Rectal abscess: Principal | ICD-10-CM | POA: Insufficient documentation

## 2017-09-27 DIAGNOSIS — K610 Anal abscess: Secondary | ICD-10-CM | POA: Diagnosis not present

## 2017-09-27 DIAGNOSIS — K4020 Bilateral inguinal hernia, without obstruction or gangrene, not specified as recurrent: Secondary | ICD-10-CM | POA: Insufficient documentation

## 2017-09-27 DIAGNOSIS — Z5321 Procedure and treatment not carried out due to patient leaving prior to being seen by health care provider: Secondary | ICD-10-CM

## 2017-09-27 DIAGNOSIS — Z79899 Other long term (current) drug therapy: Secondary | ICD-10-CM | POA: Insufficient documentation

## 2017-09-27 DIAGNOSIS — K6289 Other specified diseases of anus and rectum: Secondary | ICD-10-CM

## 2017-09-27 DIAGNOSIS — R300 Dysuria: Secondary | ICD-10-CM | POA: Diagnosis not present

## 2017-09-27 HISTORY — DX: Hypothyroidism, unspecified: E03.9

## 2017-09-27 HISTORY — DX: Essential (primary) hypertension: I10

## 2017-09-27 HISTORY — DX: Disorder of thyroid, unspecified: E07.9

## 2017-09-27 LAB — CBC WITH DIFFERENTIAL/PLATELET
Abs Immature Granulocytes: 0.1 10*3/uL (ref 0.0–0.1)
Basophils Absolute: 0.1 10*3/uL (ref 0.0–0.1)
Basophils Relative: 1 %
Eosinophils Absolute: 0.2 10*3/uL (ref 0.0–0.7)
Eosinophils Relative: 1 %
HCT: 42.4 % (ref 39.0–52.0)
Hemoglobin: 14.4 g/dL (ref 13.0–17.0)
Immature Granulocytes: 1 %
Lymphocytes Relative: 17 %
Lymphs Abs: 2.4 10*3/uL (ref 0.7–4.0)
MCH: 30.5 pg (ref 26.0–34.0)
MCHC: 34 g/dL (ref 30.0–36.0)
MCV: 89.8 fL (ref 78.0–100.0)
Monocytes Absolute: 1.8 10*3/uL — ABNORMAL HIGH (ref 0.1–1.0)
Monocytes Relative: 13 %
Neutro Abs: 9.2 10*3/uL — ABNORMAL HIGH (ref 1.7–7.7)
Neutrophils Relative %: 67 %
Platelets: 278 10*3/uL (ref 150–400)
RBC: 4.72 MIL/uL (ref 4.22–5.81)
RDW: 12.1 % (ref 11.5–15.5)
WBC: 13.7 10*3/uL — ABNORMAL HIGH (ref 4.0–10.5)

## 2017-09-27 LAB — URINALYSIS, ROUTINE W REFLEX MICROSCOPIC
Bilirubin Urine: NEGATIVE
Glucose, UA: NEGATIVE mg/dL
Hgb urine dipstick: NEGATIVE
Ketones, ur: NEGATIVE mg/dL
Leukocytes, UA: NEGATIVE
Nitrite: NEGATIVE
Protein, ur: NEGATIVE mg/dL
Specific Gravity, Urine: 1.016 (ref 1.005–1.030)
pH: 6 (ref 5.0–8.0)

## 2017-09-27 LAB — BASIC METABOLIC PANEL
Anion gap: 12 (ref 5–15)
BUN: 7 mg/dL (ref 6–20)
CO2: 25 mmol/L (ref 22–32)
Calcium: 9.2 mg/dL (ref 8.9–10.3)
Chloride: 92 mmol/L — ABNORMAL LOW (ref 101–111)
Creatinine, Ser: 0.67 mg/dL (ref 0.61–1.24)
GFR calc Af Amer: >60 mL/min (ref >60)
GFR calc non Af Amer: >60 mL/min (ref >60)
Glucose, Bld: 91 mg/dL (ref 65–99)
Potassium: 4 mmol/L (ref 3.5–5.1)
Sodium: 129 mmol/L — ABNORMAL LOW (ref 135–145)

## 2017-09-27 MED ORDER — SODIUM CHLORIDE 0.9 % IV SOLN
INTRAVENOUS | Status: DC
  Administered 2017-09-27 – 2017-09-28 (×3): via INTRAVENOUS

## 2017-09-27 MED ORDER — ONDANSETRON 4 MG PO TBDP: 4.0000 mg | ORAL_TABLET | Freq: Four times a day (QID) | ORAL | Status: DC | PRN

## 2017-09-27 MED ORDER — CIPROFLOXACIN IN D5W 400 MG/200ML IV SOLN
400.0000 mg | Freq: Two times a day (BID) | INTRAVENOUS | Status: DC
  Administered 2017-09-28 – 2017-09-29 (×3): 400 mg via INTRAVENOUS
  Filled 2017-09-27 (×4): qty 200

## 2017-09-27 MED ORDER — ENOXAPARIN SODIUM 40 MG/0.4ML ~~LOC~~ SOLN
40.0000 mg | SUBCUTANEOUS | Status: DC
  Administered 2017-09-28: 40 mg via SUBCUTANEOUS
  Filled 2017-09-27 (×2): qty 0.4

## 2017-09-27 MED ORDER — METOPROLOL TARTRATE 5 MG/5ML IV SOLN: 5.0000 mg | Freq: Four times a day (QID) | INTRAVENOUS | Status: DC | PRN

## 2017-09-27 MED ORDER — POLYETHYLENE GLYCOL 3350 17 G PO PACK: 17.0000 g | PACK | Freq: Every day | ORAL | Status: DC | PRN

## 2017-09-27 MED ORDER — OXYCODONE HCL 5 MG PO TABS
5.0000 mg | ORAL_TABLET | ORAL | Status: DC | PRN
  Administered 2017-09-27 – 2017-09-28 (×2): 10 mg via ORAL
  Filled 2017-09-27 (×2): qty 2

## 2017-09-27 MED ORDER — ACETAMINOPHEN 500 MG PO TABS
1000.0000 mg | ORAL_TABLET | Freq: Four times a day (QID) | ORAL | Status: DC
  Administered 2017-09-27 – 2017-09-29 (×5): 1000 mg via ORAL
  Filled 2017-09-27 (×5): qty 2

## 2017-09-27 MED ORDER — DIPHENHYDRAMINE HCL 50 MG/ML IJ SOLN: 25.0000 mg | Freq: Four times a day (QID) | INTRAMUSCULAR | Status: DC | PRN

## 2017-09-27 MED ORDER — HYDRALAZINE HCL 20 MG/ML IJ SOLN: 10.0000 mg | INTRAMUSCULAR | Status: DC | PRN

## 2017-09-27 MED ORDER — HYDROMORPHONE HCL 2 MG/ML IJ SOLN
0.5000 mg | Freq: Once | INTRAMUSCULAR | Status: AC
  Administered 2017-09-27: 0.5 mg via INTRAVENOUS
  Filled 2017-09-27: qty 1

## 2017-09-27 MED ORDER — DIPHENHYDRAMINE HCL 25 MG PO CAPS: 25.0000 mg | ORAL_CAPSULE | Freq: Four times a day (QID) | ORAL | Status: DC | PRN

## 2017-09-27 MED ORDER — FAMOTIDINE 20 MG PO TABS
20.0000 mg | ORAL_TABLET | Freq: Two times a day (BID) | ORAL | Status: DC
  Administered 2017-09-28: 20 mg via ORAL
  Filled 2017-09-27: qty 1

## 2017-09-27 MED ORDER — ONDANSETRON HCL 4 MG/2ML IJ SOLN: 4.0000 mg | Freq: Four times a day (QID) | INTRAMUSCULAR | Status: DC | PRN

## 2017-09-27 MED ORDER — HYDROMORPHONE HCL 2 MG/ML IJ SOLN
0.5000 mg | INTRAMUSCULAR | Status: DC | PRN
  Administered 2017-09-27 – 2017-09-29 (×5): 0.5 mg via INTRAVENOUS
  Filled 2017-09-27 (×5): qty 1

## 2017-09-27 MED ORDER — HYDROMORPHONE HCL 2 MG/ML IJ SOLN
0.5000 mg | Freq: Once | INTRAMUSCULAR | Status: AC
Start: 2017-09-27 — End: 2017-09-27
  Administered 2017-09-27: 0.5 mg via INTRAVENOUS
  Filled 2017-09-27: qty 1

## 2017-09-27 MED ORDER — IOHEXOL 300 MG/ML  SOLN
100.0000 mL | Freq: Once | INTRAMUSCULAR | Status: AC | PRN
  Administered 2017-09-27: 100 mL via INTRAVENOUS

## 2017-09-27 MED ORDER — KETOROLAC TROMETHAMINE 30 MG/ML IJ SOLN: 30.0000 mg | Freq: Four times a day (QID) | INTRAMUSCULAR | Status: DC | PRN

## 2017-09-27 MED ORDER — METRONIDAZOLE IN NACL 5-0.79 MG/ML-% IV SOLN
500.0000 mg | Freq: Three times a day (TID) | INTRAVENOUS | Status: DC
  Administered 2017-09-27 – 2017-09-29 (×5): 500 mg via INTRAVENOUS
  Filled 2017-09-27 (×6): qty 100

## 2017-09-27 MED ORDER — DOCUSATE SODIUM 100 MG PO CAPS
100.0000 mg | ORAL_CAPSULE | Freq: Two times a day (BID) | ORAL | Status: DC
  Administered 2017-09-27 – 2017-09-28 (×2): 100 mg via ORAL
  Filled 2017-09-27 (×2): qty 1

## 2017-09-27 NOTE — ED Notes (Signed)
Pt given turkey sandwich and water

## 2017-09-27 NOTE — Progress Notes (Signed)
Patient received from ED  To room 6N26. Alert and oriented x4. Skin intact. Pain 3/10. Oriented to room and call bell.

## 2017-09-27 NOTE — Plan of Care (Signed)
  Problem: Pain Managment: Goal: General experience of comfort will improve Outcome: Progressing   

## 2017-09-27 NOTE — H&P (Signed)
Surgical H&P  CC: rectal pain  HPI: this is a very pleasant and otherwise healthy 57 year old gentleman with progressively worsening perirectal pain which has escalated over the last 2 or 3 days. He states that he has had sciatica-type pain for 3 months, but about a week ago started to have a different type of pain which has become almost unbearable. He presented to his primary care doctor yesterday who referred him to a general surgeon for possible hemorrhoids. After this it looks like he came to the emergency room with pain but then left prior to evaluation, and has returned this afternoon with worsening pain. He denies any fevers. He does state he had a couple episodes of near fainting with sweats. He has never had any prior similar symptoms. Last normal bowel movement was about 3 days ago, he did have a small one earlier today but it is very painful. No drainage or bleeding from the rectum.He had a colonoscopy in 2017 and says he had 5 benign polyps and is due to return in 2022. No prior perianal procedures or history of inflammatory bowel disease.  No Known Allergies  Past Medical History:  Diagnosis Date  . Thyroid disease     History reviewed. No pertinent surgical history.  History reviewed. No pertinent family history.  Social History   Socioeconomic History  . Marital status: Single    Spouse name: Not on file  . Number of children: Not on file  . Years of education: Not on file  . Highest education level: Not on file  Occupational History  . Not on file  Social Needs  . Financial resource strain: Not on file  . Food insecurity:    Worry: Not on file    Inability: Not on file  . Transportation needs:    Medical: Not on file    Non-medical: Not on file  Tobacco Use  . Smoking status: Current Some Day Smoker    Types: Cigarettes  Substance and Sexual Activity  . Alcohol use: Yes    Comment: 6-12 beers/day  . Drug use: Yes    Types: Marijuana    Comment: occasional   . Sexual activity: Not on file  Lifestyle  . Physical activity:    Days per week: Not on file    Minutes per session: Not on file  . Stress: Not on file  Relationships  . Social connections:    Talks on phone: Not on file    Gets together: Not on file    Attends religious service: Not on file    Active member of club or organization: Not on file    Attends meetings of clubs or organizations: Not on file    Relationship status: Not on file  Other Topics Concern  . Not on file  Social History Narrative  . Not on file    No current facility-administered medications on file prior to encounter.    Current Outpatient Medications on File Prior to Encounter  Medication Sig Dispense Refill  . levothyroxine (SYNTHROID, LEVOTHROID) 125 MCG tablet Take 125 mcg by mouth daily before breakfast.      Review of Systems: a complete, 10pt review of systems was unable to be completed due to patient mental status  Physical Exam: Vitals:   09/27/17 1751 09/27/17 2042  BP: (!) 143/101 (!) 142/83  Pulse: 76 75  Resp: 16 16  Temp: 98.3 F (36.8 C)   SpO2: 99% 95%   Gen: A&Ox3, no distress  Head: normocephalic,  atraumatic Eyes: extraocular motions intact, anicteric.  Neck: supple without mass or thyromegaly Chest: unlabored respirations, symmetrical air entry, clear bilaterally   Cardiovascular: RRR with palpable distal pulses, no pedal edema Abdomen: soft, nondistended, nontender. No mass or organomegaly.  Extremities: warm, without edema, no deformities  Neuro: grossly intact Psych: appropriate mood and affect, normal insight  Skin: warm and dry Rectum: on external exam there is swelling and mild erythema with induration, fluctuance and tenderness most prominent in the right posterior perianal field but extending across the midline a couple centimeters. Digital exam deferred.   CBC Latest Ref Rng & Units 09/27/2017 03/20/2017 09/25/2009  WBC 4.0 - 10.5 K/uL 13.7(H) 7.1 5.4  Hemoglobin  13.0 - 17.0 g/dL 62.9 52.8 41.3  Hematocrit 39.0 - 52.0 % 42.4 45.4 42.0  Platelets 150 - 400 K/uL 278 210 163.0    CMP Latest Ref Rng & Units 09/27/2017 03/20/2017 09/25/2009  Glucose 65 - 99 mg/dL 91 244(W) 94  BUN 6 - 20 mg/dL 7 7 14   Creatinine 0.61 - 1.24 mg/dL 1.02 7.25 1.0  Sodium 366 - 145 mmol/L 129(L) 135 144  Potassium 3.5 - 5.1 mmol/L 4.0 4.8 5.4(H)  Chloride 101 - 111 mmol/L 92(L) 100(L) 108  CO2 22 - 32 mmol/L 25 25 30   Calcium 8.9 - 10.3 mg/dL 9.2 9.7 9.8  Total Protein 6.5 - 8.1 g/dL - 7.2 7.2  Total Bilirubin 0.3 - 1.2 mg/dL - 1.0 1.0  Alkaline Phos 38 - 126 U/L - 39 38(L)  AST 15 - 41 U/L - 28 24  ALT 17 - 63 U/L - 21 18    No results found for: INR, PROTIME  Imaging: Ct Abdomen Pelvis W Contrast  Result Date: 09/27/2017 CLINICAL DATA:  Unbearable rectal pain for 2 days. Assess for abscess. EXAM: CT ABDOMEN AND PELVIS WITH CONTRAST TECHNIQUE: Multidetector CT imaging of the abdomen and pelvis was performed using the standard protocol following bolus administration of intravenous contrast. CONTRAST:  OMNIPAQUE IOHEXOL 300 MG/ML  SOLN COMPARISON:  MRI of the abdomen May 09, 2017 FINDINGS: LOWER CHEST: Lung bases are clear. Included heart size is normal. No pericardial effusion. HEPATOBILIARY: Liver and gallbladder are normal. PANCREAS: Normal. SPLEEN: Normal. 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 Demonstrating symmetric enhancement. No nephrolithiasis, or solid renal masses. Small RIGHT parapelvic cysts. The unopacified ureters are normal in course and caliber. Delayed imaging through the kidneys demonstrates symmetric prompt contrast excretion within the proximal urinary collecting system. Urinary bladder is well distended and unremarkable. Normal adrenal glands. STOMACH/BOWEL: 2.8 x 3.1 x 4.2 cm perianal abscess with surrounding inflammatory changes. Small and large bowel are normal in course and caliber with mild  amount of retained large bowel stool. VASCULAR/LYMPHATIC: Aortoiliac vessels are normal in course and caliber. Trace calcific atherosclerosis. No lymphadenopathy by CT size criteria. REPRODUCTIVE: Normal. OTHER: No intraperitoneal free fluid or free air.No perineal subcutaneous gas. MUSCULOSKELETAL: Nonacute. Small bilateral fat containing inguinal hernias. Moderate degenerative changes lower lumbar spine resulting in moderate to severe RIGHT L5-S1 neural foraminal narrowing. IMPRESSION: 1. 2.8 x 3.1 x 4.2 cm perianal abscess. 2. Mild amount of retained large bowel stool without bowel obstruction or acute intra-abdominal/pelvic process. 3. Acute findings discussed with and reconfirmed by PA.JEFFREY HEDGES on 09/27/2017 at 8:54 pm. Aortic Atherosclerosis (ICD10-I70.0). Electronically Signed   By: Awilda Metro M.D.   On: 09/27/2017 20:54      A/P: 57 year old gentleman with perirectal abscess. Plan rectal exam under anesthesia and  incision and drainage of abscess in the OR tomorrow with Dr. Luisa Hartornett. I discussed the procedure with him including risks of bleeding, infection, possibility of fistula in ano and need for future procedures. Questions were open and answered. In the interim we will admit for IV antibiotics, pain control and gentle bowel regimen.    Phylliss Blakeshelsea Jemel Ono, MD [facility], [place] Pager [phone]

## 2017-09-27 NOTE — ED Triage Notes (Signed)
Pt reports rectal pain x2 days, states he saw his PCP today who referred him to a surgeon for hemorrhoids. Reports pain is unbearable. Taking tramadol for pain, took one pta.

## 2017-09-27 NOTE — ED Notes (Signed)
ED Provider at bedside. 

## 2017-09-27 NOTE — ED Notes (Signed)
Patient approached [facility] stated that he felt better and would be following up with his primary care physician later today. Patient was encouraged to stay but patient stated he was leaving.

## 2017-09-27 NOTE — ED Provider Notes (Signed)
[facility] EMERGENCY DEPARTMENT Provider Note   CSN: [id number] Arrival date & time: 09/27/17  1744   History   Chief Complaint Chief Complaint  Patient presents with  . Rectal Pain    HPI Donald Osborne is a 57 y.o. male.  HPI   57 year old male presents today with complaints of rectal pain.  Patient notes approximately noticed a pain in his rectum that has progressively worsened.  Patient notes that he passed a stool today that was soft but very small amount and extremely painful.  He notes he was seen by his primary care provider yesterday who was concerned enough that she referred him to general surgery.  Patient denies any fever but notes some chills, he denies any nausea vomiting or abdominal pain.  Patient notes he is also had some difficulty with his urine stream recently.  No history of rectal pain previously, no anal penetration.  Patient notes a history of chronic low back pain, he was scheduled for an epidural today, and has been taking tramadol over the last several days, he notes symptoms in his rectum started prior to taking the medication.  LAStT ORAL was 6:30 for liquids and 8:00 AM solid foods.   Past Medical History:  Diagnosis Date  . Thyroid disease     Patient Active Problem List   Diagnosis Date Noted  . GERD 09/01/2009  . DYSPHAGIA 09/01/2009  . ABNORMAL FINDINGS GI TRACT 09/01/2009  . HYPOTHYROIDISM 07/02/2008  . HYPERLIPIDEMIA 07/02/2008  . HYPERTENSION 07/02/2008    History reviewed. No pertinent surgical history.      Home Medications    Prior to Admission medications   Medication Sig Start Date End Date Taking? Authorizing Provider  ranitidine (ZANTAC) 150 MG tablet Take 1 tablet (150 mg total) by mouth 2 (two) times daily. 03/20/17   Luevenia Maxin, Mina A, PA-C  sucralfate (CARAFATE) 1 g tablet Take 1 tablet (1 g total) by mouth 4 (four) times daily -  with meals and at bedtime for 14 days. 03/20/17 04/03/17  Jeanie Sewer, PA-C     Family History History reviewed. No pertinent family history.  Social History Social History   Tobacco Use  . Smoking status: Current Some Day Smoker    Types: Cigarettes  Substance Use Topics  . Alcohol use: Yes    Comment: 6-12 beers/day  . Drug use: Yes    Types: Marijuana    Comment: occasional     Allergies   Patient has no known allergies.   Review of Systems Review of Systems  All other systems reviewed and are negative.   Physical Exam Updated Vital Signs BP (!) 142/83   Pulse 75   Temp 98.3 F (36.8 C) (Oral)   Resp 16   Ht 6\' 1"  (1.854 m)   Wt 86.2 kg (190 lb)   SpO2 95%   BMI 25.07 kg/m   Physical Exam  Constitutional: He is oriented to person, place, and time. He appears well-developed and well-nourished.  HENT:  Head: Normocephalic and atraumatic.  Eyes: Pupils are equal, round, and reactive to light. Conjunctivae are normal. Right eye exhibits no discharge. Left eye exhibits no discharge. No scleral icterus.  Neck: Normal range of motion. No JVD present. No tracheal deviation present.  Pulmonary/Chest: Effort normal. No stridor.  Genitourinary:  Genitourinary Comments: Area of induration to the posterior anus with associated  Tenderness- internal exam with acute masses   Neurological: He is alert and oriented to person, place, and time.  Coordination normal.  Psychiatric: He has a normal mood and affect. His behavior is normal. Judgment and thought content normal.  Nursing note and vitals reviewed.   ED Treatments / Results  Labs (all labs ordered are listed, but only abnormal results are displayed) Labs Reviewed  CBC WITH DIFFERENTIAL/PLATELET - Abnormal; Notable for the following components:      Result Value   WBC 13.7 (*)    Neutro Abs 9.2 (*)    Monocytes Absolute 1.8 (*)    All other components within normal limits  BASIC METABOLIC PANEL - Abnormal; Notable for the following components:   Sodium 129 (*)    Chloride 92 (*)     All other components within normal limits  URINALYSIS, ROUTINE W REFLEX MICROSCOPIC    EKG None  Radiology Ct Abdomen Pelvis W Contrast  Result Date: 09/27/2017 CLINICAL DATA:  Unbearable rectal pain for 2 days. Assess for abscess. EXAM: CT ABDOMEN AND PELVIS WITH CONTRAST TECHNIQUE: Multidetector CT imaging of the abdomen and pelvis was performed using the standard protocol following bolus administration of intravenous contrast. CONTRAST:  OMNIPAQUE IOHEXOL 300 MG/ML  SOLN COMPARISON:  MRI of the abdomen May 09, 2017 FINDINGS: LOWER CHEST: Lung bases are clear. Included heart size is normal. No pericardial effusion. HEPATOBILIARY: Liver and gallbladder are normal. PANCREAS: Normal. SPLEEN: Normal. 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 Demonstrating symmetric enhancement. No nephrolithiasis, or solid renal masses. Small RIGHT parapelvic cysts. The unopacified ureters are normal in course and caliber. Delayed imaging through the kidneys demonstrates symmetric prompt contrast excretion within the proximal urinary collecting system. Urinary bladder is well distended and unremarkable. Normal adrenal glands. STOMACH/BOWEL: 2.8 x 3.1 x 4.2 cm perianal abscess with surrounding inflammatory changes. Small and large bowel are normal in course and caliber with mild amount of retained large bowel stool. VASCULAR/LYMPHATIC: Aortoiliac vessels are normal in course and caliber. Trace calcific atherosclerosis. No lymphadenopathy by CT size criteria. REPRODUCTIVE: Normal. OTHER: No intraperitoneal free fluid or free air.No perineal subcutaneous gas. MUSCULOSKELETAL: Nonacute. Small bilateral fat containing inguinal hernias. Moderate degenerative changes lower lumbar spine resulting in moderate to severe RIGHT L5-S1 neural foraminal narrowing. IMPRESSION: 1. 2.8 x 3.1 x 4.2 cm perianal abscess. 2. Mild amount of retained large bowel stool without bowel obstruction  or acute intra-abdominal/pelvic process. 3. Acute findings discussed with and reconfirmed by PA.Valarie Farace on 09/27/2017 at 8:54 pm. Aortic Atherosclerosis (ICD10-I70.0). Electronically Signed   By: Awilda Metro M.D.   On: 09/27/2017 20:54    Procedures Procedures (including critical care time)  Medications Ordered in ED Medications  HYDROmorphone (DILAUDID) injection 0.5 mg (0.5 mg Intravenous Given 09/27/17 1837)  iohexol (OMNIPAQUE) 300 MG/ML solution 100 mL (100 mLs Intravenous Contrast Given 09/27/17 2023)  HYDROmorphone (DILAUDID) injection 0.5 mg (0.5 mg Intravenous Given 09/27/17 2133)     Initial Impression / Assessment and Plan / ED Course  I have reviewed the triage vital signs and the nursing notes.  Pertinent labs & imaging results that were available during my care of the patient were reviewed by me and considered in my medical decision making (see chart for details).    Labs: cbc, bmp,   Imaging: CT abd pelvis with contrast   Consults: General Surgery   Therapeutics: dilaudid   Discharge Meds:   Assessment/Plan: 4 YOM presents today with perianal abscess.  Patient has an approximately 3 x 3 x 4 cm abscess with induration to the anal sphincter-I do not feel  that this large of abscess so close to the anus would be amenable to bedside I&D.  Patient does have slight elevation in Colla count he is afebrile.  General surgery consulted for further evaluation.   Surgery to admit for OR management    Final Clinical Impressions(s) / ED Diagnoses   Final diagnoses:  Perianal abscess    ED Discharge Orders    None       Rosalio LoudHedges, Isa Hitz, PA-C 09/27/17 2136    Gerhard MunchLockwood, Robert, MD 09/28/17 2128

## 2017-09-27 NOTE — ED Notes (Signed)
Patient transported to CT 

## 2017-09-27 NOTE — ED Triage Notes (Signed)
Pt endorses rectal pain x 3 days. Went to his pcp and is being treated for internal hemorrhoids. Was placed on tramadol and a stool softener(but did not take it) and is constipated, but has been constipated since before starting tramadol. States pain is unbearable. Denies bleeding. LBM 3 days ago.

## 2017-09-28 ENCOUNTER — Encounter (HOSPITAL_COMMUNITY)
Admission: EM | Disposition: A | Discharge status: Home / Self Care | Payer: Self-pay | Source: Home / Self Care | Attending: Emergency Medicine

## 2017-09-28 ENCOUNTER — Other Ambulatory Visit: Payer: Self-pay

## 2017-09-28 ENCOUNTER — Observation Stay (HOSPITAL_COMMUNITY): Payer: BLUE CROSS/BLUE SHIELD | Admitting: Certified Registered Nurse Anesthetist

## 2017-09-28 ENCOUNTER — Encounter (HOSPITAL_COMMUNITY): Payer: Self-pay | Admitting: *Deleted

## 2017-09-28 DIAGNOSIS — K611 Rectal abscess: Secondary | ICD-10-CM | POA: Diagnosis not present

## 2017-09-28 HISTORY — PX: INCISION AND DRAINAGE PERIRECTAL ABSCESS: SHX1804

## 2017-09-28 LAB — CBC
HEMATOCRIT: 41 % (ref 39.0–52.0)
Hemoglobin: 13.6 g/dL (ref 13.0–17.0)
MCH: 30 pg (ref 26.0–34.0)
MCHC: 33.2 g/dL (ref 30.0–36.0)
MCV: 90.3 fL (ref 78.0–100.0)
PLATELETS: 272 10*3/uL (ref 150–400)
RBC: 4.54 MIL/uL (ref 4.22–5.81)
RDW: 12.4 % (ref 11.5–15.5)
WBC: 12 10*3/uL — AB (ref 4.0–10.5)

## 2017-09-28 LAB — BASIC METABOLIC PANEL
Anion gap: 6 (ref 5–15)
BUN: 7 mg/dL (ref 6–20)
CO2: 29 mmol/L (ref 22–32)
CREATININE: 0.74 mg/dL (ref 0.61–1.24)
Calcium: 8.9 mg/dL (ref 8.9–10.3)
Chloride: 98 mmol/L — ABNORMAL LOW (ref 101–111)
GFR calc Af Amer: >60 mL/min (ref >60)
GFR calc non Af Amer: >60 mL/min (ref >60)
GLUCOSE: 99 mg/dL (ref 65–99)
POTASSIUM: 4.8 mmol/L (ref 3.5–5.1)
SODIUM: 133 mmol/L — AB (ref 135–145)

## 2017-09-28 LAB — SURGICAL PCR SCREEN
MRSA, PCR: NEGATIVE
STAPHYLOCOCCUS AUREUS: NEGATIVE

## 2017-09-28 LAB — HIV ANTIBODY (ROUTINE TESTING W REFLEX): HIV Screen 4th Generation wRfx: NONREACTIVE

## 2017-09-28 SURGERY — INCISION AND DRAINAGE, ABSCESS, PERIRECTAL
Anesthesia: General | Site: Anus

## 2017-09-28 MED ORDER — LACTATED RINGERS IV SOLN
INTRAVENOUS | Status: DC
  Administered 2017-09-28: 10:00:00 via INTRAVENOUS

## 2017-09-28 MED ORDER — HYDROMORPHONE HCL 1 MG/ML IJ SOLN: 0.2500 mg | INTRAMUSCULAR | Status: DC | PRN

## 2017-09-28 MED ORDER — MEPERIDINE HCL 50 MG/ML IJ SOLN: 6.2500 mg | INTRAMUSCULAR | Status: DC | PRN

## 2017-09-28 MED ORDER — LACTATED RINGERS IV SOLN
INTRAVENOUS | Status: DC | PRN
  Administered 2017-09-28: 10:00:00 via INTRAVENOUS

## 2017-09-28 MED ORDER — KETOROLAC TROMETHAMINE 30 MG/ML IJ SOLN: 30.0000 mg | Freq: Once | INTRAMUSCULAR | Status: DC | PRN

## 2017-09-28 MED ORDER — SUGAMMADEX SODIUM 200 MG/2ML IV SOLN
INTRAVENOUS | Status: DC | PRN
  Administered 2017-09-28: 200 mg via INTRAVENOUS

## 2017-09-28 MED ORDER — 0.9 % SODIUM CHLORIDE (POUR BTL) OPTIME
TOPICAL | Status: DC | PRN
  Administered 2017-09-28: 1000 mL

## 2017-09-28 MED ORDER — LIDOCAINE 2% (20 MG/ML) 5 ML SYRINGE
INTRAMUSCULAR | Status: AC
  Filled 2017-09-28: qty 5

## 2017-09-28 MED ORDER — DEXAMETHASONE SODIUM PHOSPHATE 10 MG/ML IJ SOLN
INTRAMUSCULAR | Status: AC
  Filled 2017-09-28: qty 1

## 2017-09-28 MED ORDER — DEXAMETHASONE SODIUM PHOSPHATE 10 MG/ML IJ SOLN
INTRAMUSCULAR | Status: DC | PRN
  Administered 2017-09-28: 10 mg via INTRAVENOUS

## 2017-09-28 MED ORDER — LIDOCAINE 2% (20 MG/ML) 5 ML SYRINGE
INTRAMUSCULAR | Status: DC | PRN
  Administered 2017-09-28: 100 mg via INTRAVENOUS

## 2017-09-28 MED ORDER — ROCURONIUM BROMIDE 50 MG/5ML IV SOLN
INTRAVENOUS | Status: AC
  Filled 2017-09-28: qty 1

## 2017-09-28 MED ORDER — PROPOFOL 10 MG/ML IV BOLUS
INTRAVENOUS | Status: DC | PRN
  Administered 2017-09-28: 20 mg via INTRAVENOUS
  Administered 2017-09-28: 30 mg via INTRAVENOUS
  Administered 2017-09-28: 50 mg via INTRAVENOUS
  Administered 2017-09-28: 100 mg via INTRAVENOUS
  Administered 2017-09-28: 50 mg via INTRAVENOUS

## 2017-09-28 MED ORDER — OXYCODONE-ACETAMINOPHEN 5-325 MG PO TABS: 1.0000 | ORAL_TABLET | ORAL | Status: DC | PRN

## 2017-09-28 MED ORDER — SUGAMMADEX SODIUM 200 MG/2ML IV SOLN
INTRAVENOUS | Status: AC
  Filled 2017-09-28: qty 2

## 2017-09-28 MED ORDER — MIDAZOLAM HCL 2 MG/2ML IJ SOLN
INTRAMUSCULAR | Status: AC
  Filled 2017-09-28: qty 2

## 2017-09-28 MED ORDER — DIAZEPAM 5 MG PO TABS
5.0000 mg | ORAL_TABLET | Freq: Four times a day (QID) | ORAL | Status: DC | PRN
  Administered 2017-09-28: 5 mg via ORAL
  Filled 2017-09-28: qty 1

## 2017-09-28 MED ORDER — ONDANSETRON HCL 4 MG/2ML IJ SOLN
INTRAMUSCULAR | Status: DC | PRN
  Administered 2017-09-28: 4 mg via INTRAVENOUS

## 2017-09-28 MED ORDER — MIDAZOLAM HCL 5 MG/5ML IJ SOLN
INTRAMUSCULAR | Status: DC | PRN
  Administered 2017-09-28: 2 mg via INTRAVENOUS

## 2017-09-28 MED ORDER — PROMETHAZINE HCL 25 MG/ML IJ SOLN: 6.2500 mg | INTRAMUSCULAR | Status: DC | PRN

## 2017-09-28 MED ORDER — FENTANYL CITRATE (PF) 250 MCG/5ML IJ SOLN
INTRAMUSCULAR | Status: AC
  Filled 2017-09-28: qty 5

## 2017-09-28 MED ORDER — BUPIVACAINE-EPINEPHRINE (PF) 0.25% -1:200000 IJ SOLN
INTRAMUSCULAR | Status: AC
  Filled 2017-09-28: qty 30

## 2017-09-28 MED ORDER — BUPIVACAINE-EPINEPHRINE 0.25% -1:200000 IJ SOLN
INTRAMUSCULAR | Status: DC | PRN
  Administered 2017-09-28: 10 mL

## 2017-09-28 MED ORDER — ONDANSETRON HCL 4 MG/2ML IJ SOLN
INTRAMUSCULAR | Status: AC
  Filled 2017-09-28: qty 2

## 2017-09-28 MED ORDER — FENTANYL CITRATE (PF) 250 MCG/5ML IJ SOLN
INTRAMUSCULAR | Status: DC | PRN
  Administered 2017-09-28 (×2): 50 ug via INTRAVENOUS
  Administered 2017-09-28: 150 ug via INTRAVENOUS

## 2017-09-28 MED ORDER — PROPOFOL 10 MG/ML IV BOLUS
INTRAVENOUS | Status: AC
  Filled 2017-09-28: qty 40

## 2017-09-28 MED ORDER — ROCURONIUM BROMIDE 100 MG/10ML IV SOLN
INTRAVENOUS | Status: DC | PRN
  Administered 2017-09-28: 35 mg via INTRAVENOUS

## 2017-09-28 SURGICAL SUPPLY — 34 items
CANISTER SUCT 3000ML PPV (MISCELLANEOUS) ×2 IMPLANT
COVER SURGICAL LIGHT HANDLE (MISCELLANEOUS) ×2 IMPLANT
DRAPE UTILITY XL STRL (DRAPES) ×4 IMPLANT
DRSG PAD ABDOMINAL 8X10 ST (GAUZE/BANDAGES/DRESSINGS) ×2 IMPLANT
ELECT REM PT RETURN 9FT ADLT (ELECTROSURGICAL) ×2
ELECTRODE REM PT RTRN 9FT ADLT (ELECTROSURGICAL) IMPLANT
GAUZE PACKING IODOFORM 1 (PACKING) IMPLANT
GAUZE PACKING IODOFORM 1/2 (PACKING) ×1 IMPLANT
GAUZE SPONGE 4X4 12PLY STRL (GAUZE/BANDAGES/DRESSINGS) ×2 IMPLANT
GAUZE SPONGE 4X4 16PLY XRAY LF (GAUZE/BANDAGES/DRESSINGS) ×2 IMPLANT
GLOVE BIO SURGEON STRL SZ8 (GLOVE) ×2 IMPLANT
GLOVE BIOGEL PI IND STRL 8 (GLOVE) ×1 IMPLANT
GLOVE BIOGEL PI INDICATOR 8 (GLOVE) ×1
GOWN STRL REUS W/ TWL LRG LVL3 (GOWN DISPOSABLE) ×2 IMPLANT
GOWN STRL REUS W/ TWL XL LVL3 (GOWN DISPOSABLE) ×1 IMPLANT
GOWN STRL REUS W/TWL LRG LVL3 (GOWN DISPOSABLE) ×4
GOWN STRL REUS W/TWL XL LVL3 (GOWN DISPOSABLE) ×2
KIT BASIN OR (CUSTOM PROCEDURE TRAY) ×2 IMPLANT
KIT TURNOVER KIT B (KITS) ×2 IMPLANT
NDL HYPO 25GX1X1/2 BEV (NEEDLE) IMPLANT
NEEDLE HYPO 25GX1X1/2 BEV (NEEDLE) ×2 IMPLANT
NS IRRIG 1000ML POUR BTL (IV SOLUTION) ×2 IMPLANT
PACK LITHOTOMY IV (CUSTOM PROCEDURE TRAY) ×2 IMPLANT
PAD ARMBOARD 7.5X6 YLW CONV (MISCELLANEOUS) ×4 IMPLANT
PENCIL BUTTON HOLSTER BLD 10FT (ELECTRODE) ×1 IMPLANT
SWAB COLLECTION DEVICE MRSA (MISCELLANEOUS) ×2 IMPLANT
SWAB CULTURE ESWAB REG 1ML (MISCELLANEOUS) ×2 IMPLANT
SYR BULB IRRIGATION 50ML (SYRINGE) ×1 IMPLANT
SYR CONTROL 10ML LL (SYRINGE) ×1 IMPLANT
TOWEL OR 17X24 6PK STRL BLUE (TOWEL DISPOSABLE) ×2 IMPLANT
TOWEL OR 17X26 10 PK STRL BLUE (TOWEL DISPOSABLE) ×2 IMPLANT
TUBE CONNECTING 12X1/4 (SUCTIONS) ×2 IMPLANT
UNDERPAD 30X30 (UNDERPADS AND DIAPERS) ×2 IMPLANT
YANKAUER SUCT BULB TIP NO VENT (SUCTIONS) ×2 IMPLANT

## 2017-09-28 NOTE — Anesthesia Postprocedure Evaluation (Signed)
Anesthesia Post Note  Patient: Georgiann CockerJohn Hargraves  Procedure(s) Performed: INCISION  AND DRAINAGE  PERIRECTAL ABSCESS; RECTAL EXAM UNDER ANESTHESIA (N/A Anus)     Patient location during evaluation: PACU Anesthesia Type: General Level of consciousness: awake Pain management: pain level controlled Vital Signs Assessment: post-procedure vital signs reviewed and stable Respiratory status: spontaneous breathing Cardiovascular status: stable Postop Assessment: no apparent nausea or vomiting Anesthetic complications: no    Last Vitals:  Vitals:   09/28/17 1122 09/28/17 1130  BP: (!) 163/95 (!) 157/93  Pulse: 64 63  Resp: 10 (!) 24  Temp:  36.5 C  SpO2: 94% 95%    Last Pain:  Vitals:   09/28/17 1130  TempSrc:   PainSc: 0-No pain   Pain Goal:                 Woodrow Dulski JR,Atlee Yliana Gravois

## 2017-09-28 NOTE — Anesthesia Preprocedure Evaluation (Addendum)
Anesthesia Evaluation  Patient identified by MRN, date of birth, ID band Patient awake    Reviewed: Allergy & Precautions, NPO status , Patient's Chart, lab work & pertinent test results  Airway Mallampati: I  TM Distance: >3 FB Neck ROM: Full    Dental no notable dental hx. (+) Teeth Intact, Dental Advisory Given   Pulmonary Current Smoker,    Pulmonary exam normal breath sounds clear to auscultation       Cardiovascular hypertension, Normal cardiovascular exam Rhythm:Regular Rate:Normal     Neuro/Psych negative neurological ROS  negative psych ROS   GI/Hepatic Neg liver ROS, GERD  Medicated,  Endo/Other  Hypothyroidism   Renal/GU negative Renal ROS  negative genitourinary   Musculoskeletal negative musculoskeletal ROS (+)   Abdominal Normal abdominal exam  (+)   Peds  Hematology negative hematology ROS (+)   Anesthesia Other Findings   Reproductive/Obstetrics                            Anesthesia Physical Anesthesia Plan  ASA: II  Anesthesia Plan: General   Post-op Pain Management:    Induction: Intravenous  PONV Risk Score and Plan: 2 and Ondansetron and Dexamethasone  Airway Management Planned: Oral ETT  Additional Equipment:   Intra-op Plan:   Post-operative Plan: Extubation in OR  Informed Consent: I have reviewed the patients History and Physical, chart, labs and discussed the procedure including the risks, benefits and alternatives for the proposed anesthesia with the patient or authorized representative who has indicated his/her understanding and acceptance.   Dental advisory given  Plan Discussed with: CRNA and Surgeon  Anesthesia Plan Comments:         Anesthesia Quick Evaluation

## 2017-09-28 NOTE — Op Note (Addendum)
Preoperative diagnosis: Complex perirectal abscess  Postoperative diagnosis: Same  Procedure: Incision and drainage of complex perirectal  abscess  Surgeon: Harriette Bouillonhomas Olevia Westervelt, MD  Anesthesia: General with local  Assistant: Rayburn PA-C  Drains: None  EBL: 20 cc  IV fluids: Per anesthesia record  Indications for procedure: The patient is a 57 year old male admitted last night with a posterior lateral right complex perirectal abscess diagnosed by exam and CT scan.  He is a 2-week history of severe perianal pain which worsened over the last 48 hours.  He sought care in the emergency room.The procedure has been discussed with the patient.  Alternative therapies have been discussed with the patient.  Operative risks include bleeding,  Infection,  Organ injury,  Nerve injury, incontinence, fistula,   Blood vessel injury,  DVT,  Pulmonary embolism,  Death,  And possible reoperation.  Medical management risks include worsening of present situation.  The success of the procedure is 50 -90 % at treating patients symptoms.  The patient understands and agrees to proceed.   Description of procedure: The patient was brought from the holding area to the operating room.  After induction of general anesthesia he was placed in lithotomy.  He was padded appropriately with no undue stress on his lower extremities.  The perianal region was prepped and draped in sterile fashion.  Timeout was done.  The abscess was easily identifiable on examination and post and pointed to the posterior lateral right position.  Local anesthetic was infiltrated consisting of 0.25% Sensorcaine local.  Cruciate incision was made and a large amount of pus was expressed.  This was opened and irrigated out.  Loculations were broken up.  Sphincter muscles left intact.  Packing was then placed to one half and gyriform packing.  Digital examination was otherwise normal.  All final counts were found to be correct.  Large bulky dry dressing placed.   He was removed from  lithotomy.  He was extubated taken recovery in satisfactory condition.  All final counts were found to be correct.

## 2017-09-28 NOTE — Progress Notes (Signed)
Back from PACU via bed. Alert, oriented x4. Dressing in situ

## 2017-09-28 NOTE — Anesthesia Procedure Notes (Signed)
Procedure Name: Intubation Date/Time: 09/28/2017 10:04 AM Performed by: Nils PyleBell, Jaydi Bray T, CRNA Pre-anesthesia Checklist: Patient identified, Emergency Drugs available, Suction available and Patient being monitored Patient Re-evaluated:Patient Re-evaluated prior to induction Oxygen Delivery Method: Circle System Utilized Preoxygenation: Pre-oxygenation with 100% oxygen Induction Type: IV induction Ventilation: Mask ventilation without difficulty Laryngoscope Size: Miller and 2 Grade View: Grade I Tube type: Oral Tube size: 7.5 mm Number of attempts: 1 Airway Equipment and Method: Stylet and Oral airway Placement Confirmation: ETT inserted through vocal cords under direct vision,  positive ETCO2 and breath sounds checked- equal and bilateral Secured at: 22 cm Tube secured with: Tape Dental Injury: Teeth and Oropharynx as per pre-operative assessment

## 2017-09-28 NOTE — Discharge Instructions (Signed)
Perirectal Abscess °An abscess is an infected area that contains a collection of pus. A perirectal abscess is an abscess that is near the opening of the anus or around the rectum. A perirectal abscess can cause a lot of pain, especially during bowel movements. °What are the causes? °This condition is almost always caused by an infection that starts in an anal gland. °What increases the risk? °This condition is more likely to develop in: °· People with diabetes or inflammatory bowel disease. °· People whose body defense system (immune system) is weak. °· People who have anal sex. °· People who have a sexually transmitted disease (STD). °· People who have certain kinds of cancers, such as rectal carcinoma, leukemia, or lymphoma. ° °What are the signs or symptoms? °The main symptom of this condition is pain. The pain may be a throbbing pain that gets worse during bowel movements. Other symptoms include: °· Fever. °· Swelling. °· Redness. °· Bleeding. °· Constipation. ° °How is this diagnosed? °The condition is diagnosed with a physical exam. If the abscess is not visible, a health care provider may need to place a finger inside the rectum to find the abscess. Sometimes, imaging tests are done to determine the size and location of the abscess. These tests may include: °· An ultrasound. °· An MRI. °· A CT scan. ° °How is this treated? °This condition is usually treated with incision and drainage surgery. Incision and drainage surgery involves making an incision over the abscess to drain the pus. Treatment may also involve antibiotic medicine, pain medicine, stool softeners, or laxatives. °Follow these instructions at home: °· Take medicines only as directed by your health care provider. °· If you were prescribed an antibiotic, finish all of it even if you start to feel better. °· To relieve pain, try sitting: °? In a warm, shallow bath (sitz bath). °? On a heating pad with the setting on low. °? On an inflatable  donut-shaped cushion. °· Follow any diet instructions as directed by your health care provider. °· Keep all follow-up visits as directed by your health care provider. This is important. °Contact a health care provider if: °· Your abscess is bleeding. °· You have pain, swelling, or redness that is getting worse. °· You are constipated. °· You feel ill. °· You have muscle aches or chills. °· You have a fever. °· Your symptoms return after the abscess has healed. °This information is not intended to replace advice given to you by your health care provider. Make sure you discuss any questions you have with your health care provider. °Document Released: 03/25/2000 Document Revised: 09/03/2015 Document Reviewed: 02/05/2014 °Elsevier Interactive Patient Education © 2018 [employer] ° °How to Take a Sitz Bath °A sitz bath is a warm water bath that is taken while you are sitting down. The water should only come up to your hips and should cover your buttocks. Your health care provider may recommend a sitz bath to help you: °· Clean the lower part of your body, including your genital area. °· With itching. °· With pain. °· With sore muscles or muscles that tighten or spasm. ° °How to take a sitz bath °Take 3-4 sitz baths per day or as told by your health care provider. °1. Partially fill a bathtub with warm water. You will only need the water to be deep enough to cover your hips and buttocks when you are sitting in it. °2. If your health care provider told you to put medicine in   the water, follow the directions exactly. °3. Sit in the water and open the tub drain a little. °4. Turn on the warm water again to keep the tub at the correct level. Keep the water running constantly. °5. Soak in the water for 15-20 minutes or as told by your health care provider. °6. After the sitz bath, pat the affected area dry first. Do not rub it. °7. Be careful when you stand up after the sitz bath because you may feel dizzy. ° °Contact a  health care provider if: °· Your symptoms get worse. Do not continue with sitz baths if your symptoms get worse. °· You have new symptoms. Do not continue with sitz baths until you talk with your health care provider. °This information is not intended to replace advice given to you by your health care provider. Make sure you discuss any questions you have with your health care provider. °Document Released: 12/19/2003 Document Revised: 08/26/2015 Document Reviewed: 03/26/2014 °Elsevier Interactive Patient Education © 2018 [employer] ° °

## 2017-09-28 NOTE — Transfer of Care (Signed)
Immediate Anesthesia Transfer of Care Note  Patient: Donald CockerJohn Osborne  Procedure(s) Performed: INCISION  AND DRAINAGE  PERIRECTAL ABSCESS; RECTAL EXAM UNDER ANESTHESIA (N/A Anus)  Patient Location: PACU  Anesthesia Type:General  Level of Consciousness: awake, alert  and oriented  Airway & Oxygen Therapy: Patient Spontanous Breathing  Post-op Assessment: Report given to RN, Post -op Vital signs reviewed and stable and Patient moving all extremities X 4  Post vital signs: Reviewed and stable  Last Vitals:  Vitals Value Taken Time  BP 144/91 09/28/2017 11:07 AM  Temp    Pulse 70 09/28/2017 11:07 AM  Resp 15 09/28/2017 11:07 AM  SpO2 95 % 09/28/2017 11:07 AM  Vitals shown include unvalidated device data.  Last Pain:  Vitals:   09/28/17 0814  TempSrc:   PainSc: 7          Complications: No apparent anesthesia complications

## 2017-09-28 NOTE — Progress Notes (Signed)
Day of Surgery   Subjective/Chief Complaint: Patient seen and examined in chart and CT reviewed.  He has a moderate size posterior perirectal abscess.  Plan is for drainage today in the operating room.  The procedure was discussed with the patient.  Risk and benefits and long-term expectations discussed with the patient.  Risk of bleeding, infection, fistula, incontinence, recurrence, anesthesia risk, and other options discussed.   Objective: Vital signs in last 24 hours: Temp:  [97.7 F (36.5 C)-98.3 F (36.8 C)] 97.7 F (36.5 C) (06/20 1610) Pulse Rate:  [61-76] 61 (06/20 0632) Resp:  [16] 16 (06/20 9604) BP: (128-153)/(76-101) 128/76 (06/20 5409) SpO2:  [95 %-100 %] 100 % (06/20 8119) Weight:  [86.2 kg (190 lb)] 86.2 kg (190 lb) (06/19 1752) Last BM Date: 09/24/17  Intake/Output from previous day: 06/19 0701 - 06/20 0700 In: 886.1 [P.O.:444; I.V.:142.1; IV Piggyback:300] Out: -  Intake/Output this shift: No intake/output data recorded.  Pelvic: Tender anal canal posterior lateral right  Lab Results:  Recent Labs    09/27/17 1835 09/28/17 0647  WBC 13.7* 12.0*  HGB 14.4 13.6  HCT 42.4 41.0  PLT 278 272   BMET Recent Labs    09/27/17 1835 09/28/17 0647  NA 129* 133*  K 4.0 4.8  CL 92* 98*  CO2 25 29  GLUCOSE 91 99  BUN 7 7  CREATININE 0.67 0.74  CALCIUM 9.2 8.9   PT/INR No results for input(s): LABPROT, INR in the last 72 hours. ABG No results for input(s): PHART, HCO3 in the last 72 hours.  Invalid input(s): PCO2, PO2  Studies/Results: Ct Abdomen Pelvis W Contrast  Result Date: 09/27/2017 CLINICAL DATA:  Unbearable rectal pain for 2 days. Assess for abscess. EXAM: CT ABDOMEN AND PELVIS WITH CONTRAST TECHNIQUE: Multidetector CT imaging of the abdomen and pelvis was performed using the standard protocol following bolus administration of intravenous contrast. CONTRAST:  OMNIPAQUE IOHEXOL 300 MG/ML  SOLN COMPARISON:  MRI of the abdomen May 09, 2017 FINDINGS: LOWER CHEST: Lung bases are clear. Included heart size is normal. No pericardial effusion. HEPATOBILIARY: Liver and gallbladder are normal. PANCREAS: Normal. SPLEEN: Normal. 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 Demonstrating symmetric enhancement. No nephrolithiasis, or solid renal masses. Small RIGHT parapelvic cysts. The unopacified ureters are normal in course and caliber. Delayed imaging through the kidneys demonstrates symmetric prompt contrast excretion within the proximal urinary collecting system. Urinary bladder is well distended and unremarkable. Normal adrenal glands. STOMACH/BOWEL: 2.8 x 3.1 x 4.2 cm perianal abscess with surrounding inflammatory changes. Small and large bowel are normal in course and caliber with mild amount of retained large bowel stool. VASCULAR/LYMPHATIC: Aortoiliac vessels are normal in course and caliber. Trace calcific atherosclerosis. No lymphadenopathy by CT size criteria. REPRODUCTIVE: Normal. OTHER: No intraperitoneal free fluid or free air.No perineal subcutaneous gas. MUSCULOSKELETAL: Nonacute. Small bilateral fat containing inguinal hernias. Moderate degenerative changes lower lumbar spine resulting in moderate to severe RIGHT L5-S1 neural foraminal narrowing. IMPRESSION: 1. 2.8 x 3.1 x 4.2 cm perianal abscess. 2. Mild amount of retained large bowel stool without bowel obstruction or acute intra-abdominal/pelvic process. 3. Acute findings discussed with and reconfirmed by PA.JEFFREY HEDGES on 09/27/2017 at 8:54 pm. Aortic Atherosclerosis (ICD10-I70.0). Electronically Signed   By: Awilda Metro M.D.   On: 09/27/2017 20:54    Anti-infectives: Anti-infectives (From admission, onward)   Start     Dose/Rate Route Frequency Ordered Stop   09/27/17 2200  metroNIDAZOLE (FLAGYL) IVPB 500 mg  500 mg 100 mL/hr over 60 Minutes Intravenous Every 8 hours 09/27/17 2140     09/27/17 2200  ciprofloxacin (CIPRO)  IVPB 400 mg     400 mg 200 mL/hr over 60 Minutes Intravenous Every 12 hours 09/27/17 2140        Assessment/Plan: Perirectal abscess  Plan for incision and drainage today in the operating room.  Procedure discussed with patient and questions answered.   LOS: 0 days    Clovis Puhomas A Dyrell Tuccillo 09/28/2017

## 2017-09-28 NOTE — Progress Notes (Signed)
Notified Dr Fredricka Bonineonnor of patient c/o severe rectal pain despite oxycodone and dilaudid given, new order received.

## 2017-09-28 NOTE — Progress Notes (Signed)
Patient claimed he has difficulty voiding. Did not void since yesterday morning. Bladder scan showed bladder full about 1000cc. Order from PA to do In and out cath-done-obtained amber colored urine

## 2017-09-29 ENCOUNTER — Encounter (HOSPITAL_COMMUNITY): Payer: Self-pay | Admitting: Surgery

## 2017-09-29 MED ORDER — OXYCODONE HCL 5 MG PO TABS: 5.0000 mg | ORAL_TABLET | Freq: Four times a day (QID) | ORAL | 0 refills | Status: DC | PRN

## 2017-09-29 MED ORDER — METRONIDAZOLE 500 MG PO TABS: 500.0000 mg | ORAL_TABLET | Freq: Two times a day (BID) | ORAL | 0 refills | Status: AC

## 2017-09-29 MED ORDER — ACETAMINOPHEN 500 MG PO TABS: 1000.0000 mg | ORAL_TABLET | Freq: Four times a day (QID) | ORAL | 0 refills | Status: DC | PRN

## 2017-09-29 MED ORDER — DOCUSATE SODIUM 100 MG PO CAPS: 100.0000 mg | ORAL_CAPSULE | Freq: Two times a day (BID) | ORAL | 0 refills | Status: DC

## 2017-09-29 MED ORDER — CIPROFLOXACIN HCL 500 MG PO TABS: 500.0000 mg | ORAL_TABLET | Freq: Two times a day (BID) | ORAL | 0 refills | Status: DC

## 2017-09-29 MED ORDER — POLYETHYLENE GLYCOL 3350 17 G PO PACK: 17.0000 g | PACK | Freq: Every day | ORAL | 0 refills | Status: DC

## 2017-09-29 NOTE — Discharge Summary (Signed)
[facility] Discharge Summary   Patient ID: Donald CockerJohn Osborne MRN: [id number] DOB/AGE: 55/06/1960 55 y.o.  Admit date: 09/27/2017 Discharge date: 09/29/2017  Admitting Diagnosis: Perirectal abscess  Discharge Diagnosis Perirectal abscess  Consultants None  Imaging: Ct Abdomen Pelvis W Contrast  Result Date: 09/27/2017 CLINICAL DATA:  Unbearable rectal pain for 2 days. Assess for abscess. EXAM: CT ABDOMEN AND PELVIS WITH CONTRAST TECHNIQUE: Multidetector CT imaging of the abdomen and pelvis was performed using the standard protocol following bolus administration of intravenous contrast. CONTRAST:  100mL OMNIPAQUE IOHEXOL 300 MG/ML  SOLN COMPARISON:  MRI of the abdomen May 09, 2017 FINDINGS: LOWER CHEST: Lung bases are clear. Included heart size is normal. No pericardial effusion. HEPATOBILIARY: Liver and gallbladder are normal. PANCREAS: Normal. SPLEEN: Normal. 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 Demonstrating symmetric enhancement. No nephrolithiasis, or solid renal masses. Small RIGHT parapelvic cysts. The unopacified ureters are normal in course and caliber. Delayed imaging through the kidneys demonstrates symmetric prompt contrast excretion within the proximal urinary collecting system. Urinary bladder is well distended and unremarkable. Normal adrenal glands. STOMACH/BOWEL: 2.8 x 3.1 x 4.2 cm perianal abscess with surrounding inflammatory changes. Small and large bowel are normal in course and caliber with mild amount of retained large bowel stool. VASCULAR/LYMPHATIC: Aortoiliac vessels are normal in course and caliber. Trace calcific atherosclerosis. No lymphadenopathy by CT size criteria. REPRODUCTIVE: Normal. OTHER: No intraperitoneal free fluid or free air.No perineal subcutaneous gas. MUSCULOSKELETAL: Nonacute. Small bilateral fat containing inguinal hernias. Moderate degenerative changes lower lumbar spine resulting in moderate  to severe RIGHT L5-S1 neural foraminal narrowing. IMPRESSION: 1. 2.8 x 3.1 x 4.2 cm perianal abscess. 2. Mild amount of retained large bowel stool without bowel obstruction or acute intra-abdominal/pelvic process. 3. Acute findings discussed with and reconfirmed by PA.JEFFREY HEDGES on 09/27/2017 at 8:54 pm. Aortic Atherosclerosis (ICD10-I70.0). Electronically Signed   By: Awilda Metroourtnay  Bloomer M.D.   On: 09/27/2017 20:54    Procedures Dr. Luisa Hartornett (09/28/17) - Incision and drainage of perirectal abscess  Hospital Course:  Patient is a 57 year old male [facility].  Workup showed perirectal abcess.  Patient was admitted and underwent procedure listed above.  Tolerated procedure well and was transferred to the floor.  Diet was advanced as tolerated.  On POD#1, the patient was voiding well, tolerating diet, ambulating well, pain well controlled, vital signs stable, incisions c/d/i and felt stable for discharge home.  Patient will follow up in our office in 1 weeks and knows to call with questions or concerns.  He will call to confirm appointment date/time.    Physical Exam: General:  Alert, NAD, pleasant, comfortable Abd:  Soft, ND, Non-tender GU: packing removed from wound with moderate drainage, abscess cavity 5 cm deep, surrounding tissue with improvement in erythema and induration, minimal tenderness  Allergies as of 09/29/2017   No Known Allergies     Medication List    TAKE these medications   acetaminophen 500 MG tablet Commonly known as:  TYLENOL Take 2 tablets (1,000 mg total) by mouth every 6 (six) hours as needed for mild pain.   buPROPion 150 MG 12 hr tablet Commonly known as:  WELLBUTRIN SR Take 150 mg by mouth 2 (two) times daily.   ciprofloxacin 500 MG tablet Commonly known as:  CIPRO Take 1 tablet (500 mg total) by mouth 2 (two) times daily.   docusate sodium 100 MG capsule Commonly known as:  COLACE Take 1 capsule (100  mg total) by mouth 2  (two) times daily.   levothyroxine 125 MCG tablet Commonly known as:  SYNTHROID, LEVOTHROID Take 125 mcg by mouth daily before breakfast.   metroNIDAZOLE 500 MG tablet Commonly known as:  FLAGYL Take 1 tablet (500 mg total) by mouth 2 (two) times daily with a meal for 5 days. DO NOT CONSUME ALCOHOL WHILE TAKING THIS MEDICATION.   omeprazole 20 MG capsule Commonly known as:  PRILOSEC Take 20 mg by mouth daily as needed (acid reflux).   oxyCODONE 5 MG immediate release tablet Commonly known as:  Oxy IR/ROXICODONE Take 1 tablet (5 mg total) by mouth every 6 (six) hours as needed for moderate pain.   polyethylene glycol packet Commonly known as:  MIRALAX / GLYCOLAX Take 17 g by mouth daily.        Follow-up Information    [facility] Go on 10/05/2017.   Specialty:  General Surgery Why:  Your appointment is at 2:45 PM. Please arrive 30 min prior to appointment time. Bring photo ID and insurance information.  Contact information: [address] [place] [address] [phone]           Signed: Wells Guiles, [facility] 09/29/2017, 8:13 AM Pager: [phone] Consults: [phone] Mon-Fri 7:00 am-4:30 pm Sat-Sun 7:00 am-11:30 am

## 2017-09-29 NOTE — Plan of Care (Signed)

## 2017-10-23 DIAGNOSIS — E785 Hyperlipidemia, unspecified: Secondary | ICD-10-CM | POA: Diagnosis not present

## 2017-10-23 DIAGNOSIS — E039 Hypothyroidism, unspecified: Secondary | ICD-10-CM | POA: Diagnosis not present

## 2017-10-23 DIAGNOSIS — Z1159 Encounter for screening for other viral diseases: Secondary | ICD-10-CM | POA: Diagnosis not present

## 2017-10-23 DIAGNOSIS — Z125 Encounter for screening for malignant neoplasm of prostate: Secondary | ICD-10-CM | POA: Diagnosis not present

## 2017-10-23 DIAGNOSIS — Z0000 Encounter for general adult medical examination without abnormal findings: Secondary | ICD-10-CM | POA: Diagnosis not present

## 2017-11-02 DIAGNOSIS — Z23 Encounter for immunization: Secondary | ICD-10-CM | POA: Diagnosis not present

## 2017-11-07 DIAGNOSIS — M5416 Radiculopathy, lumbar region: Secondary | ICD-10-CM | POA: Diagnosis not present

## 2017-12-26 DIAGNOSIS — M5416 Radiculopathy, lumbar region: Secondary | ICD-10-CM | POA: Diagnosis not present

## 2018-01-09 ENCOUNTER — Ambulatory Visit
Admission: RE | Admit: 2018-01-09 | Discharge: 2018-01-09 | Disposition: A | Discharge status: Home / Self Care | Payer: BLUE CROSS/BLUE SHIELD | Source: Ambulatory Visit | Attending: Family Medicine | Admitting: Family Medicine

## 2018-01-09 ENCOUNTER — Other Ambulatory Visit: Payer: Self-pay | Admitting: Family Medicine

## 2018-01-09 DIAGNOSIS — R300 Dysuria: Secondary | ICD-10-CM | POA: Diagnosis not present

## 2018-01-09 DIAGNOSIS — Z23 Encounter for immunization: Secondary | ICD-10-CM | POA: Diagnosis not present

## 2018-01-09 DIAGNOSIS — R197 Diarrhea, unspecified: Secondary | ICD-10-CM | POA: Diagnosis not present

## 2018-01-09 DIAGNOSIS — R1031 Right lower quadrant pain: Secondary | ICD-10-CM

## 2018-01-09 MED ORDER — IOPAMIDOL (ISOVUE-300) INJECTION 61%
100.0000 mL | Freq: Once | INTRAVENOUS | Status: AC | PRN
  Administered 2018-01-09: 100 mL via INTRAVENOUS

## 2018-01-19 DIAGNOSIS — R1031 Right lower quadrant pain: Secondary | ICD-10-CM | POA: Diagnosis not present

## 2018-02-14 DIAGNOSIS — R7989 Other specified abnormal findings of blood chemistry: Secondary | ICD-10-CM | POA: Diagnosis not present

## 2018-02-23 IMAGING — MR MR ABDOMEN WO/W CM MRCP
13 of 18 series · 33 of 48 positions shown · IV contrast (multihance)
Comparison: Ultrasound from 03/30/2017.

CLINICAL DATA: Back pain.  Common bile duct dilatation.

EXAM:
MRI ABDOMEN WITHOUT AND WITH CONTRAST (INCLUDING MRCP)
TECHNIQUE: Multiplanar multisequence MR imaging of the abdomen was performed
both before and after the administration of intravenous contrast.
Heavily T2-weighted images of the biliary and pancreatic ducts were
obtained, and three-dimensional MRCP images were rendered by post
processing.
CONTRAST:  15mL MULTIHANCE GADOBENATE DIMEGLUMINE 529 MG/ML IV SOLN

[Series 3: T2 · coronal · 5.0mm · 1.41mm/px · 2 of 33 slices shown (1 of 3)]
[im 1/33]
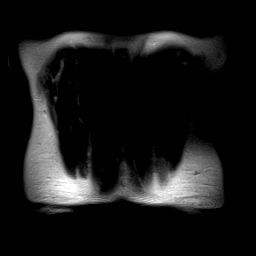
[im 33/33]
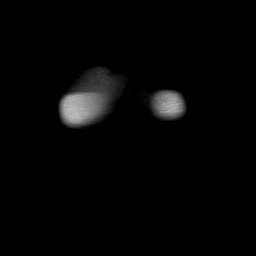

[Series 4: T2 · axial · 6.0mm · 1.41mm/px · z∈[-210,+66]mm · 2 of 41 slices shown (2 of 3)]
[im 1/41]
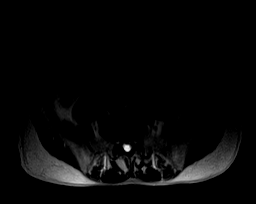
[im 41/41]
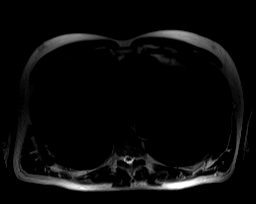

[Series 5: axial in out · axial · 6.0mm · 0.70mm/px · z∈[-166,+62]mm · 3 of 68 slices shown]
[im 1/68]
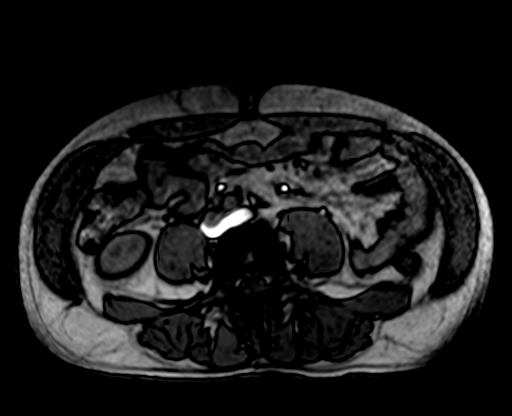
[im 34/68]
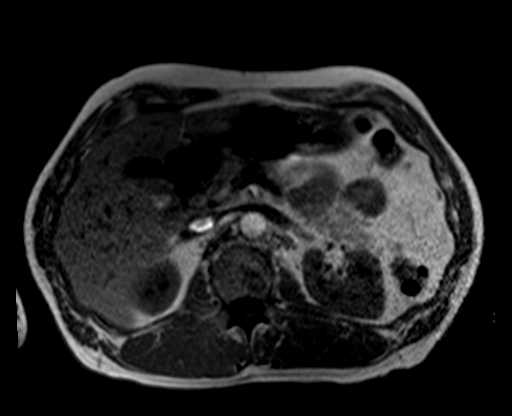
[im 68/68]
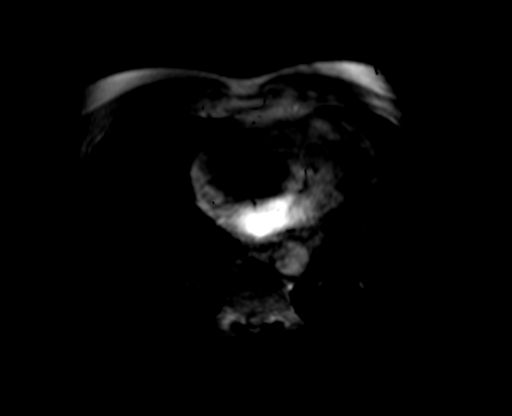

[Series 11: T2 · axial · 6.0mm · 0.70mm/px · z∈[-166,+103]mm · 2 of 40 slices shown (3 of 3)]
[im 1/40]
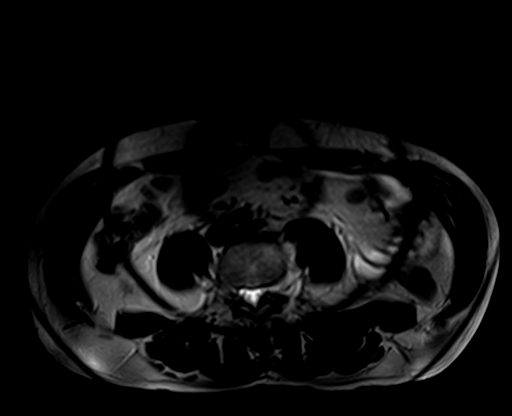
[im 40/40]
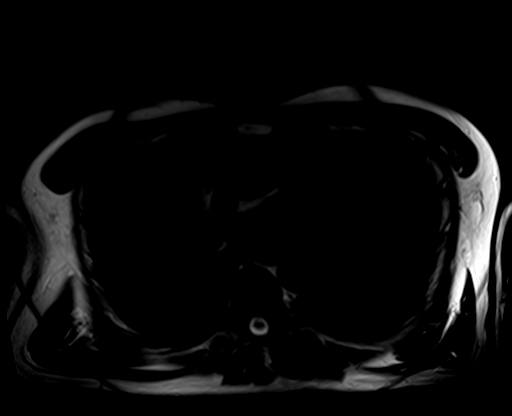

[Series 13: ep2d_diff_b50_500_800_p2 · axial · 6.0mm · 1.88mm/px · z∈[-157,+112]mm · 5 of 120 slices shown]
[im 1/120]
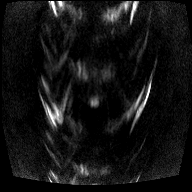
[im 30/120]
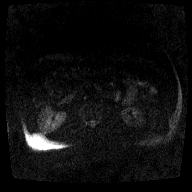
[im 60/120]
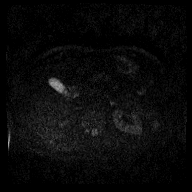
[im 90/120]
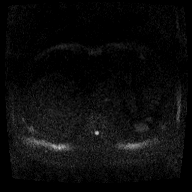
[im 120/120]
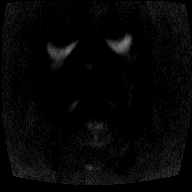

[Series 14: ep2d_[id number]_500_800_p2_adc · axial · 6.0mm · 1.88mm/px · z∈[-157,+112]mm · 2 of 40 slices shown]
[im 1/40]
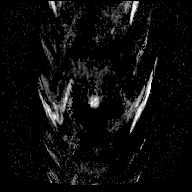
[im 40/40]
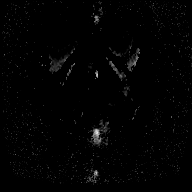

[Series 15: bSSFP · axial · 3.5mm · 1.41mm/px · z∈[-148,+74]mm · 2 of 56 slices shown]
[im 1/56]
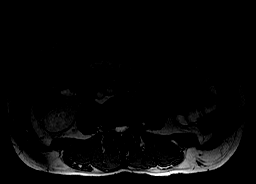
[im 56/56]
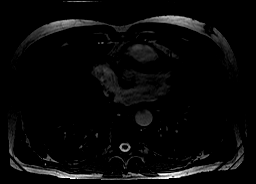

[Series 18: cor haste fs · coronal · 3.0mm · 0.70mm/px · 1 of 25 slices shown]
[im 1/25]
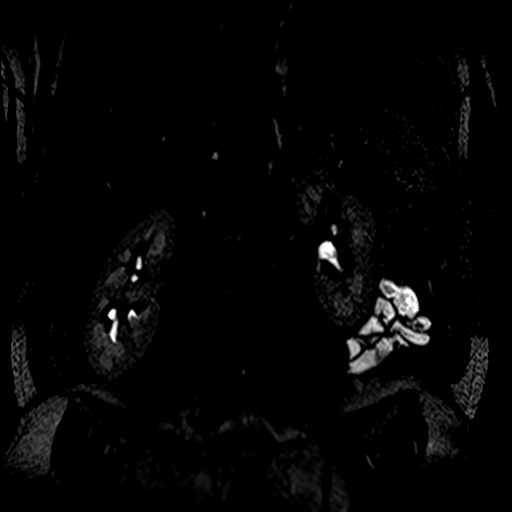

[Series 19: T1 dynamic · axial · non-contrast · 2.5mm · 0.70mm/px · z∈[-158,+80]mm · 3 of 96 slices shown]
[im 1/96]
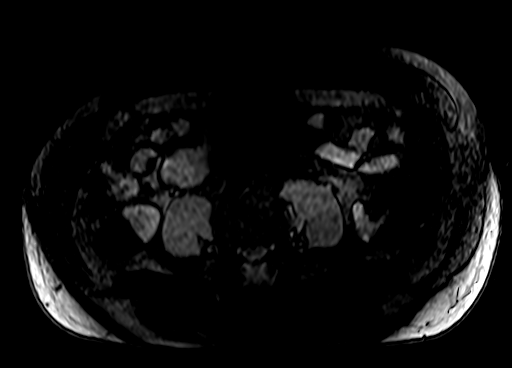
[im 48/96]
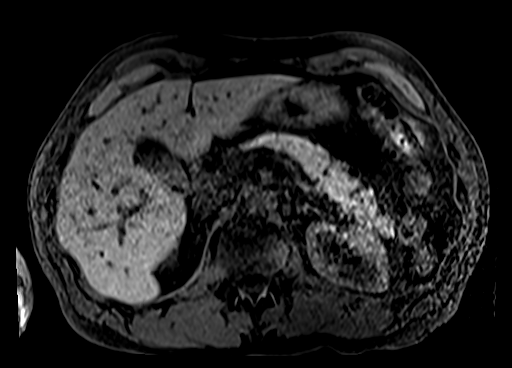
[im 96/96]
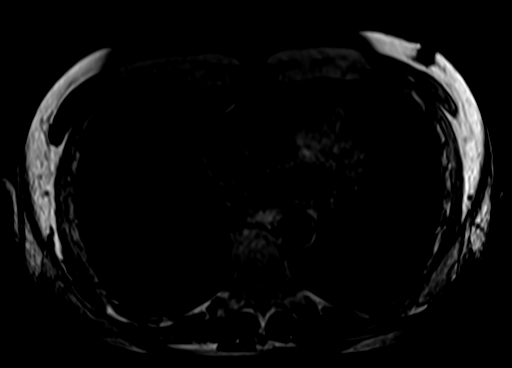

[Series 20: post 25 sec · axial · 2.5mm · 0.70mm/px · z∈[-158,+80]mm · 3 of 96 slices shown]
[im 1/96]
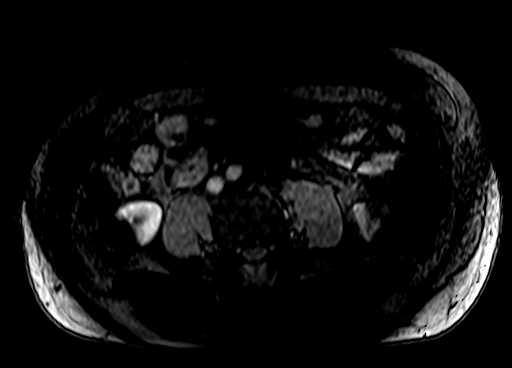
[im 48/96]
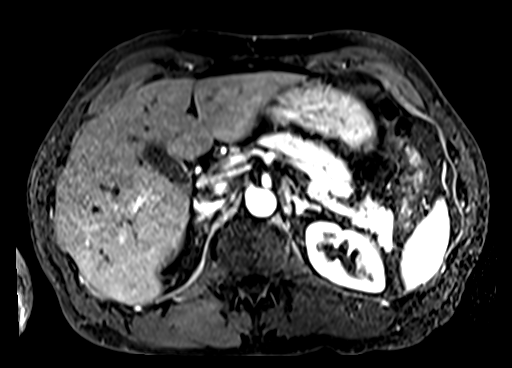
[im 96/96]
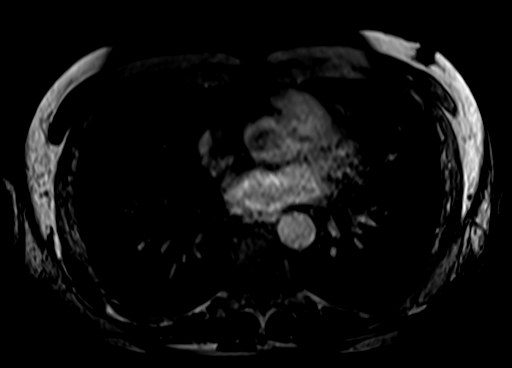

[Series 21: post 25 sec_sub · axial · 2.5mm · 0.70mm/px · z∈[-158,+80]mm · 3 of 96 slices shown]
[im 1/96]
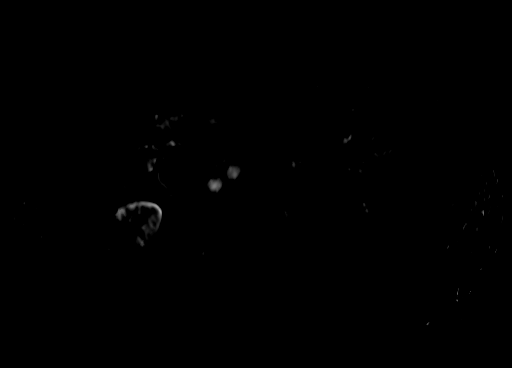
[im 48/96]
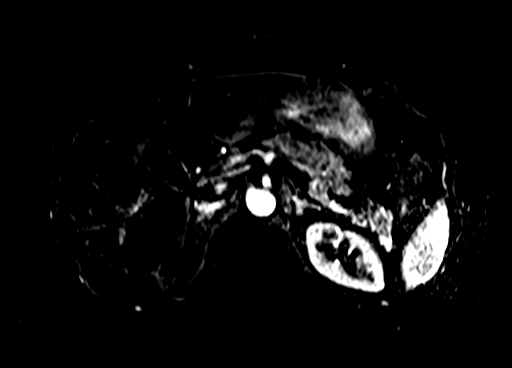
[im 96/96]
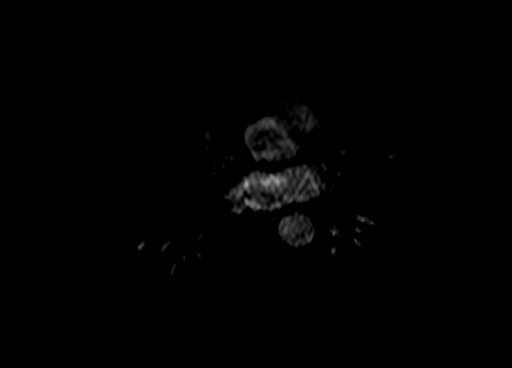

[Series 22: post 45 sec · axial · 2.5mm · 0.70mm/px · z∈[-158,+80]mm · 3 of 96 slices shown]
[im 1/96]
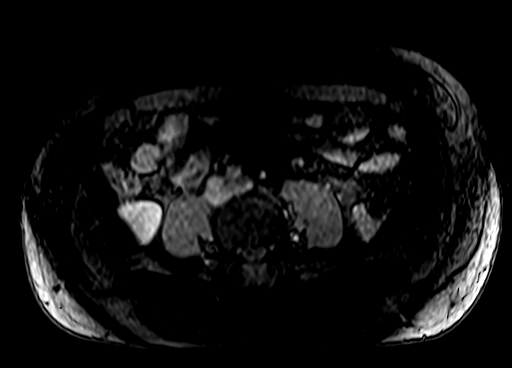
[im 48/96]
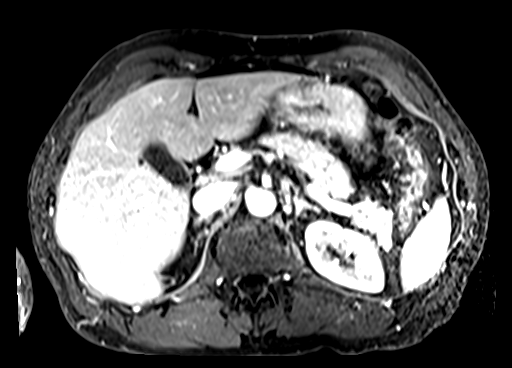
[im 96/96]
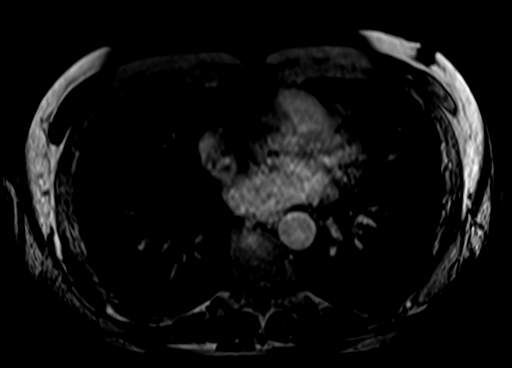

[Series 23: post 45 sec_sub · axial · 2.5mm · 0.70mm/px · z∈[-158,-40]mm · 2 of 96 slices shown]
[im 1/96]
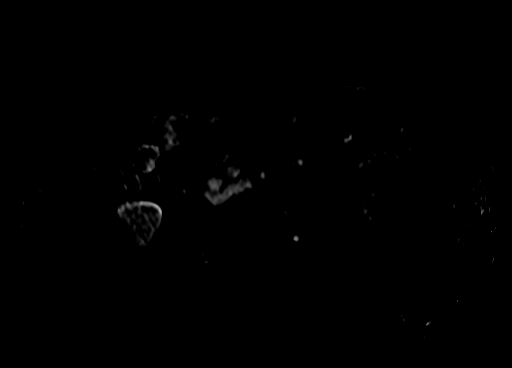
[im 48/96]
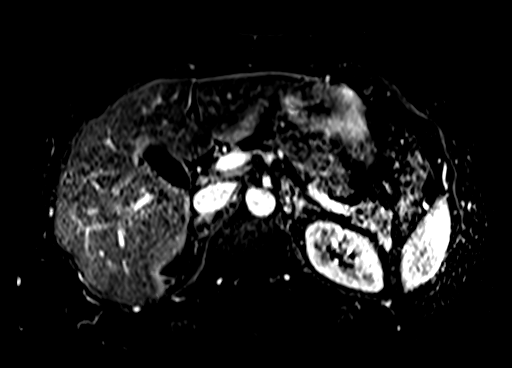

[33 of 48 positions shown; findings below may reference images not displayed]

FINDINGS: Lower chest: No acute findings.

Hepatobiliary: Lateral segment of left lobe of liver hemangioma
measures 9 mm. The gallbladder is normal. No gallstones. No
intrahepatic biliary dilatation. The common bile duct is mildly
increased in caliber measuring 7.6 mm. No choledocholithiasis.

Pancreas: Tiny cystic structure within the neck of pancreas without
enhancement measures 5 mm. No main duct dilatation. No pancreatic
mass or inflammation identified.

Spleen:  Within normal limits in size and appearance.

Adrenals/Urinary Tract:  Normal adrenal glands.

Normal appearance of the kidneys.  No mass or hydronephrosis.

Stomach/Bowel: Visualized portions within the abdomen are
unremarkable.

Vascular/Lymphatic: No pathologically enlarged lymph nodes
identified. No abdominal aortic aneurysm demonstrated.

Other:  None.

Musculoskeletal: No suspicious bone lesions identified.
IMPRESSION: 1. No acute findings within the abdomen.
2. Left lobe of liver hemangioma.
3. 5 mm cystic structure within neck of pancreas without enhancement
is noted. The appearance is nonspecific but likely represents a
benign abnormality such as a small pseudocyst or benign cystic
lesion of the pancreas. Follow-up imaging in 12 months with repeat
pancreas protocol MRI is advised. This recommendation follows ACR
consensus guidelines: Management of Incidental Pancreatic Cysts: A
White Paper of the ACR Incidental Findings Committee. [HOSPITAL] 3226;[DATE].

## 2018-04-20 ENCOUNTER — Other Ambulatory Visit: Payer: Self-pay | Admitting: Family Medicine

## 2018-04-20 DIAGNOSIS — K862 Cyst of pancreas: Secondary | ICD-10-CM

## 2018-10-27 ENCOUNTER — Other Ambulatory Visit: Payer: BLUE CROSS/BLUE SHIELD

## 2020-04-06 ENCOUNTER — Other Ambulatory Visit: Payer: Self-pay | Admitting: Family Medicine

## 2020-04-06 DIAGNOSIS — K862 Cyst of pancreas: Secondary | ICD-10-CM

## 2020-04-17 ENCOUNTER — Other Ambulatory Visit: Payer: Self-pay

## 2020-04-17 ENCOUNTER — Ambulatory Visit
Admission: RE | Admit: 2020-04-17 | Discharge: 2020-04-17 | Disposition: A | Discharge status: Home / Self Care | Payer: 59 | Source: Ambulatory Visit | Attending: Family Medicine | Admitting: Family Medicine

## 2020-04-17 DIAGNOSIS — K862 Cyst of pancreas: Secondary | ICD-10-CM

## 2020-04-17 MED ORDER — GADOBENATE DIMEGLUMINE 529 MG/ML IV SOLN
18.0000 mL | Freq: Once | INTRAVENOUS | Status: AC | PRN
  Administered 2020-04-17: 18 mL via INTRAVENOUS

## 2020-12-06 ENCOUNTER — Emergency Department (HOSPITAL_COMMUNITY): Payer: 59

## 2020-12-06 ENCOUNTER — Other Ambulatory Visit: Payer: Self-pay

## 2020-12-06 ENCOUNTER — Encounter (HOSPITAL_COMMUNITY): Payer: Self-pay | Admitting: Emergency Medicine

## 2020-12-06 ENCOUNTER — Emergency Department (HOSPITAL_COMMUNITY)
Admission: EM | Admit: 2020-12-06 | Discharge: 2020-12-06 | Disposition: A | Discharge status: Home / Self Care | Payer: 59 | Attending: Emergency Medicine | Admitting: Emergency Medicine

## 2020-12-06 DIAGNOSIS — Z79899 Other long term (current) drug therapy: Secondary | ICD-10-CM | POA: Insufficient documentation

## 2020-12-06 DIAGNOSIS — I10 Essential (primary) hypertension: Secondary | ICD-10-CM | POA: Diagnosis not present

## 2020-12-06 DIAGNOSIS — F17210 Nicotine dependence, cigarettes, uncomplicated: Secondary | ICD-10-CM | POA: Insufficient documentation

## 2020-12-06 DIAGNOSIS — E039 Hypothyroidism, unspecified: Secondary | ICD-10-CM | POA: Diagnosis not present

## 2020-12-06 DIAGNOSIS — R042 Hemoptysis: Secondary | ICD-10-CM | POA: Insufficient documentation

## 2020-12-06 LAB — COMPREHENSIVE METABOLIC PANEL
ALT: 17 U/L (ref 0–44)
AST: 24 U/L (ref 15–41)
Albumin: 4.2 g/dL (ref 3.5–5.0)
Alkaline Phosphatase: 42 U/L (ref 38–126)
Anion gap: 10 (ref 5–15)
BUN: 8 mg/dL (ref 6–20)
CO2: 25 mmol/L (ref 22–32)
Calcium: 9.8 mg/dL (ref 8.9–10.3)
Chloride: 101 mmol/L (ref 98–111)
Creatinine, Ser: 0.95 mg/dL (ref 0.61–1.24)
GFR, Estimated: >60 mL/min (ref >60)
Glucose, Bld: 113 mg/dL — ABNORMAL HIGH (ref 70–99)
Potassium: 4.4 mmol/L (ref 3.5–5.1)
Sodium: 136 mmol/L (ref 135–145)
Total Bilirubin: 1 mg/dL (ref 0.3–1.2)
Total Protein: 6.9 g/dL (ref 6.5–8.1)

## 2020-12-06 LAB — CBC
HCT: 43 % (ref 39.0–52.0)
Hemoglobin: 14.6 g/dL (ref 13.0–17.0)
MCH: 30.2 pg (ref 26.0–34.0)
MCHC: 34 g/dL (ref 30.0–36.0)
MCV: 89 fL (ref 80.0–100.0)
Platelets: 264 10*3/uL (ref 150–400)
RBC: 4.83 MIL/uL (ref 4.22–5.81)
RDW: 12.2 % (ref 11.5–15.5)
WBC: 6.6 10*3/uL (ref 4.0–10.5)
nRBC: 0 % (ref 0.0–0.2)

## 2020-12-06 LAB — TYPE AND SCREEN
ABO/RH(D): A NEG
Antibody Screen: NEGATIVE

## 2020-12-06 MED ORDER — PREDNISONE 50 MG PO TABS: ORAL_TABLET | ORAL | 0 refills | Status: DC

## 2020-12-06 MED ORDER — IOHEXOL 350 MG/ML SOLN
75.0000 mL | Freq: Once | INTRAVENOUS | Status: AC | PRN
  Administered 2020-12-06: 75 mL via INTRAVENOUS

## 2020-12-06 MED ORDER — BENZONATATE 100 MG PO CAPS: 100.0000 mg | ORAL_CAPSULE | Freq: Three times a day (TID) | ORAL | 0 refills | Status: DC

## 2020-12-06 NOTE — ED Provider Notes (Addendum)
Emergency Medicine Provider Triage Evaluation Note  Donald Osborne , a 60 y.o. male  was evaluated in triage.  Pt complains of hemoptysis.  States this began last night around 7 PM.  He has been otherwise well recently without URI symptoms.  He denies any chest pain or shortness of breath at present but did have some vague right-sided rib pain last week.  He is not currently on any type of anticoagulation.  He denies any history of DVT or PE.  He is a former smoker, quit about 8 years ago.  No hx of cancer.  No abnormal weight loss.  Does report taking a large amount of turmeric which he read online can thin your blood so unsure if that was related.  Review of Systems  Positive: hemoptysis Negative: Fever, chills  Physical Exam  BP (!) 168/109 (BP Location: Left Arm)   Pulse 87   Temp 98.2 F (36.8 C) (Oral)   Resp 17   SpO2 90%  Gen:   Awake, no distress   Resp:  Normal effort  MSK:   Moves extremities without difficulty  Other:  Lungs CTAB, sats bordering at 90% in triage, NAD, speaking in sentences without difficulty  Medical Decision Making  Medically screening exam initiated at 4:28 AM.  Appropriate orders placed.  RANKIN COOLMAN was informed that the remainder of the evaluation will be completed by another provider, this initial triage assessment does not replace that evaluation, and the importance of remaining in the ED until their evaluation is complete.  Sats borderline at 90% in triage but no observed respiratory distress.  Labs sent along with CXR.  Likely will need advanced imaging such as CT for further evaluation.  5:22 AM Initial CXR is normal.  Will get CTA to assess for occult infection, PE, mass, etc.   Garlon Hatchet, PA-C 12/06/20 0431    Garlon Hatchet, PA-C 12/06/20 0522    Gilda Crease, MD 12/06/20 [phone]

## 2020-12-06 NOTE — ED Triage Notes (Signed)
Patient here with coughing up blood that started last night around 7pm.  He denies any shortness of breath or chest pain at this time.  He denies any upper respiratory infection in the last month.  He states that he is bringing bright red blood up when he coughs.

## 2020-12-06 NOTE — ED Provider Notes (Signed)
[facility] EMERGENCY DEPARTMENT Provider Note   CSN: [id number] Arrival date & time: 12/06/20  0344     History Chief Complaint  Patient presents with   Hemoptysis    Donald Osborne is a 60 y.o. male.  60 year old male presents with 1 day history of hemoptysis.  Patient states that he does not take any blood thinners.  Has had no weight loss recently remote history of tobacco use.  Has not been short of breath.  No recent illnesses.  States he coughed and had some bright red blood.  Denies any bruising to his body.  No treatment use prior to arrival      Past Medical History:  Diagnosis Date   Hypertension    Hypothyroidism    Thyroid disease     Patient Active Problem List   Diagnosis Date Noted   Perirectal abscess 09/27/2017   GERD 09/01/2009   DYSPHAGIA 09/01/2009   ABNORMAL FINDINGS GI TRACT 09/01/2009   HYPOTHYROIDISM 07/02/2008   HYPERLIPIDEMIA 07/02/2008   HYPERTENSION 07/02/2008    Past Surgical History:  Procedure Laterality Date   INCISION AND DRAINAGE PERIRECTAL ABSCESS N/A 09/28/2017   Procedure: INCISION  AND DRAINAGE  PERIRECTAL ABSCESS; RECTAL EXAM UNDER ANESTHESIA;  Surgeon: Erroll Luna, MD;  Location: [place];  Service: General;  Laterality: N/A;       History reviewed. No pertinent family history.  Social History   Tobacco Use   Smoking status: Some Days    Types: Cigarettes   Smokeless tobacco: Never  Substance Use Topics   Alcohol use: Yes    Comment: 6-12 beers/day   Drug use: Yes    Types: Marijuana    Comment: occasional    Home Medications Prior to Admission medications   Medication Sig Start Date End Date Taking? Authorizing Provider  acetaminophen (TYLENOL) 500 MG tablet Take 2 tablets (1,000 mg total) by mouth every 6 (six) hours as needed for mild pain. 09/29/17   Norm Parcel, PA-C  buPROPion (WELLBUTRIN SR) 150 MG 12 hr tablet Take 150 mg by mouth 2 (two) times daily.    [provider]   ciprofloxacin (CIPRO) 500 MG tablet Take 1 tablet (500 mg total) by mouth 2 (two) times daily. 09/29/17   Norm Parcel, PA-C  docusate sodium (COLACE) 100 MG capsule Take 1 capsule (100 mg total) by mouth 2 (two) times daily. 09/29/17   Norm Parcel, PA-C  levothyroxine (SYNTHROID, LEVOTHROID) 125 MCG tablet Take 125 mcg by mouth daily before breakfast.    [provider]  omeprazole (PRILOSEC) 20 MG capsule Take 20 mg by mouth daily as needed (acid reflux).    [provider]  oxyCODONE (OXY IR/ROXICODONE) 5 MG immediate release tablet Take 1 tablet (5 mg total) by mouth every 6 (six) hours as needed for moderate pain. 09/29/17   Norm Parcel, PA-C  polyethylene glycol (MIRALAX / GLYCOLAX) packet Take 17 g by mouth daily. 09/29/17   Norm Parcel, PA-C    Allergies    Patient has no known allergies.  Review of Systems   Review of Systems  All other systems reviewed and are negative.  Physical Exam Updated Vital Signs BP (!) 181/114   Pulse 68   Temp 98.2 F (36.8 C) (Oral)   Resp 18   SpO2 99%   Physical Exam Vitals and nursing note reviewed.  Constitutional:      General: He is not in acute distress.    Appearance:  Normal appearance. He is well-developed. He is not toxic-appearing.  HENT:     Head: Normocephalic and atraumatic.  Eyes:     General: Lids are normal.     Conjunctiva/sclera: Conjunctivae normal.     Pupils: Pupils are equal, round, and reactive to light.  Neck:     Thyroid: No thyroid mass.     Trachea: No tracheal deviation.  Cardiovascular:     Rate and Rhythm: Normal rate and regular rhythm.     Heart sounds: Normal heart sounds. No murmur heard.   No gallop.  Pulmonary:     Effort: Pulmonary effort is normal. No respiratory distress.     Breath sounds: Normal breath sounds. No stridor. No decreased breath sounds, wheezing, rhonchi or rales.  Abdominal:     General: There is no distension.     Palpations: Abdomen is soft.      Tenderness: There is no abdominal tenderness. There is no rebound.  Musculoskeletal:        General: No tenderness. Normal range of motion.     Cervical back: Normal range of motion and neck supple.  Skin:    General: Skin is warm and dry.     Findings: No abrasion or rash.  Neurological:     Mental Status: He is alert and oriented to person, place, and time. Mental status is at baseline.     GCS: GCS eye subscore is 4. GCS verbal subscore is 5. GCS motor subscore is 6.     Cranial Nerves: Cranial nerves are intact. No cranial nerve deficit.     Sensory: No sensory deficit.     Motor: Motor function is intact.  Psychiatric:        Attention and Perception: Attention normal.        Speech: Speech normal.        Behavior: Behavior normal.    ED Results / Procedures / Treatments   Labs (all labs ordered are listed, but only abnormal results are displayed) Labs Reviewed  COMPREHENSIVE METABOLIC PANEL - Abnormal; Notable for the following components:      Result Value   Glucose, Bld 113 (*)    All other components within normal limits  CBC  TYPE AND SCREEN  ABO/RH    EKG EKG Interpretation  Date/Time:  Sunday December 06 2020 03:58:33 EDT Ventricular Rate:  87 PR Interval:  164 QRS Duration: 90 QT Interval:  364 QTC Calculation: 438 R Axis:   81 Text Interpretation: Normal sinus rhythm Biatrial enlargement Left ventricular hypertrophy ( Sokolow-Lyon , Romhilt-Estes ) Nonspecific ST abnormality Abnormal ECG Confirmed by Lacretia Leigh ([id number]) on 12/06/2020 7:50:07 AM  Radiology DG Chest 2 View  Result Date: 12/06/2020 CLINICAL DATA:  Hemoptysis. EXAM: CHEST - 2 VIEW COMPARISON:  07/14/2009 FINDINGS: The heart size and mediastinal contours are within normal limits. Both lungs are clear. The visualized skeletal structures are unremarkable. IMPRESSION: No active cardiopulmonary disease. Electronically Signed   By: Kerby Moors M.D.   On: 12/06/2020 05:07   CT Angio Chest  PE W and/or Wo Contrast  Result Date: 12/06/2020 CLINICAL DATA:  60 year old male with hemoptysis and right side chest pain. Former smoker. EXAM: CT ANGIOGRAPHY CHEST WITH CONTRAST TECHNIQUE: Multidetector CT imaging of the chest was performed using the standard protocol during bolus administration of intravenous contrast. Multiplanar CT image reconstructions and MIPs were obtained to evaluate the vascular anatomy. CONTRAST:  42m OMNIPAQUE IOHEXOL 350 MG/ML SOLN COMPARISON:  Chest radiographs 0445 hours.  Chest CT  10/25/2004. FINDINGS: Cardiovascular: Excellent contrast bolus timing in the pulmonary arterial tree. No focal filling defect identified in the pulmonary arteries to suggest acute pulmonary embolism. Cardiac size is at the upper limits of normal. No pericardial effusion. Negative visible aorta. No calcified coronary artery atherosclerosis is evident. Mediastinum/Nodes: Negative. No mediastinal mass or lymphadenopathy. Lungs/Pleura: Lower lung volumes compared to 2006. Trace dependent secretions in the trachea. The major airways remain patent, there is increased generalized central bronchial wall thickening compared to 2006. Mild diffuse atelectasis. But superimposed bilateral lower lobe and middle lobe widespread nodular pulmonary ground-glass opacity, which appears primarily centrilobular. See series 6, images 104 and 119. No areas of consolidation. No pleural effusion. A small solid right apical lung nodule on series 6, image 22 is stable since 2006 and benign. Similar stable and benign subtle subpleural nodules such as those seen on series 6, image 29 in both apices. No new or suspicious solid lung nodules. Upper Abdomen: Negative visible liver, gallbladder, spleen, pancreas, adrenal glands, kidneys, and bowel in the upper abdomen. Musculoskeletal: No acute or suspicious osseous lesion. Review of the MIP images confirms the above findings. IMPRESSION: 1. No evidence of acute pulmonary embolus. 2.  Widespread bilateral lower and middle lobe centrilobular sub solid lung nodules, in conjunction with increased central bronchial wall thickening since 2006. Top differential considerations include Respiratory Bronchiolitis, Hypersensitivity Pneumonitis, Pulmonary Vasculitis, and endobronchial spread of an Acute Respiratory Infection. No pleural effusion. Superimposed small apical lung nodules are stable since 2006 and benign. 3. Negative visible aorta. Electronically Signed   By: Genevie Ann M.D.   On: 12/06/2020 07:34    Procedures Procedures   Medications Ordered in ED Medications  iohexol (OMNIPAQUE) 350 MG/ML injection 75 mL (75 mLs Intravenous Contrast Given 12/06/20 0716)    ED Course  I have reviewed the triage vital signs and the nursing notes.  Pertinent labs & imaging results that were available during my care of the patient were reviewed by me and considered in my medical decision making (see chart for details).    MDM Rules/Calculators/A&P                           Patient had work-up initiated prior to me seeing him.  This included CBC c-Met along with chest CT and chest x-ray.  Notes CBC shows normal platelets.  No evidence of anemia.  Chest CT significant for pulmonary nodules.  Also airway inflammation possibly from bronchitis.  Patient has pulmonary nodules as well.  Plan will be to place patient on cough suppressants as well as placed on course of prednisone.  Will give referral to pulmonary.  Patient informed that his pulmonary nodules and need for follow-up with this.  Patient agreeable to this and return precautions given Final Clinical Impression(s) / ED Diagnoses Final diagnoses:  None    Rx / DC Orders ED Discharge Orders     None        Lacretia Leigh, MD 12/06/20 [phone]

## 2021-01-07 ENCOUNTER — Institutional Professional Consult (permissible substitution): Payer: 59 | Admitting: Pulmonary Disease

## 2021-02-01 ENCOUNTER — Ambulatory Visit: Payer: 59 | Admitting: Pulmonary Disease

## 2021-02-01 ENCOUNTER — Other Ambulatory Visit: Payer: Self-pay

## 2021-02-01 ENCOUNTER — Encounter: Payer: Self-pay | Admitting: Pulmonary Disease

## 2021-02-01 VITALS — BP 150/90 | HR 73 | Temp 99.1°F

## 2021-02-01 DIAGNOSIS — R918 Other nonspecific abnormal finding of lung field: Secondary | ICD-10-CM | POA: Diagnosis not present

## 2021-02-01 DIAGNOSIS — R042 Hemoptysis: Secondary | ICD-10-CM | POA: Diagnosis not present

## 2021-02-01 NOTE — Progress Notes (Signed)
Subjective:   PATIENT ID: Donald Osborne GENDER: male DOB: 1961-01-11, MRN: [id number]   HPI  Chief Complaint  Patient presents with   Consult    DOE    Reason for Visit: New consult for shortness of breath and hx single episode of hemoptysis  Donald Osborne is a 60 year old male former smoker with HTN, hypothyroidism who presents as a new consult.  He presents for follow-up for hemoptysis. He was seen in the ED on 12/06/20 for one day of hemoptysis. 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 CT chest demonstrated pumonary nodules. He was treated with prednisone ad referred to Pulmonary.  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 Attributes to being out of shape. Still able to complete his tasks. Denies wheezing and coughing. He sometimes has productive cough with clear sputum associated with allergies  Social History: Currently social smoker Previously smoked 22 years x 1ppd. 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  Past Medical History:  Diagnosis Date   Hypertension    Hypothyroidism    Thyroid disease      Family History  Problem Relation Age of Onset   Hypertension Other      Social History   Occupational History   Not on file  Tobacco Use   Smoking status: Some Days    Packs/day: 1.00    Years: 22.00    Pack years: 22.00    Types: Cigarettes   Smokeless tobacco: Never  Substance and Sexual Activity   Alcohol use: Yes    Comment: 6-12 beers/day   Drug use: Yes    Types: Marijuana    Comment: occasional   Sexual activity: Not on file    No Known Allergies   Outpatient Medications Prior to Visit  Medication Sig Dispense Refill   acetaminophen (TYLENOL) 500 MG tablet Take 2 tablets (1,000 mg total) by mouth every 6 (six)  hours as needed for mild pain. 30 tablet 0   buPROPion (WELLBUTRIN SR) 150 MG 12 hr tablet Take 150 mg by mouth 2 (two) times daily.     levothyroxine (SYNTHROID, LEVOTHROID) 125 MCG tablet Take 125 mcg by mouth daily before breakfast.     losartan (COZAAR) 50 MG tablet Take 50 mg by mouth daily.     omeprazole (PRILOSEC) 20 MG capsule Take 20 mg by mouth daily as needed (acid reflux).     benzonatate (TESSALON) 100 MG capsule Take 1 capsule (100 mg total) by mouth every 8 (eight) hours. (Patient not taking: Reported on 02/01/2021) 21 capsule 0   ciprofloxacin (CIPRO) 500 MG tablet Take 1 tablet (500 mg total) by mouth 2 (two) times daily. (Patient not taking: Reported on 02/01/2021) 10 tablet 0   docusate sodium (COLACE) 100 MG capsule Take 1 capsule (100 mg total) by mouth 2 (two) times daily. (Patient not taking: Reported on 02/01/2021) 10 capsule 0   oxyCODONE (OXY IR/ROXICODONE) 5 MG immediate release tablet Take 1 tablet (5 mg total) by mouth every 6 (six) hours as needed for moderate pain. (Patient not taking: Reported on 02/01/2021) 15 tablet 0   polyethylene glycol (MIRALAX / GLYCOLAX) packet Take 17 g by mouth daily. (Patient not taking: Reported on 02/01/2021) 14 each 0   predniSONE (DELTASONE) 50 MG tablet 1 p.o.  daily x5 (Patient not taking: Reported on 02/01/2021) 5 tablet 0   No facility-administered medications prior to visit.    Review of Systems  Constitutional:  Negative for chills, diaphoresis, fever, malaise/fatigue and weight loss.  HENT:  Negative for congestion, ear pain and sore throat.   Respiratory:  Positive for cough, sputum production and shortness of breath. Negative for hemoptysis and wheezing.   Cardiovascular:  Negative for chest pain, palpitations and leg swelling.  Gastrointestinal:  Positive for heartburn. Negative for abdominal pain and nausea.  Genitourinary:  Negative for frequency.  Musculoskeletal:  Negative for joint pain and myalgias.  Skin:   Negative for itching and rash.  Neurological:  Negative for dizziness, weakness and headaches.  Endo/Heme/Allergies:  Does not bruise/bleed easily.  Psychiatric/Behavioral:  Negative for depression. The patient is not nervous/anxious.     Objective:   Vitals:   02/01/21 0851  BP: (!) 150/90  Pulse: 73  Temp: 99.1 F (37.3 C)  TempSrc: Oral  SpO2: 100%   SpO2: 100 % O2 Device: None (Room air)  Physical Exam: General: Well-appearing, no acute distress HENT: [place], AT Eyes: EOMI, no scleral icterus Respiratory: Clear to auscultation bilaterally.  No crackles, wheezing or rales Cardiovascular: RRR, -M/R/G, no JVD Extremities:-Edema,-tenderness Neuro: AAO x4, CNII-XII grossly intact Psych: Normal mood, normal affect  Data Reviewed:  Imaging: CTA 12/06/20 - bilateral lower and middle lob subsolid nodular GGO, right apical nodule measuring 4.5 mccstable since 2006. Subpleural nodules in apices that are stable.   PFT: None on file  Labs: CBC    Component Value Date/Time   WBC 6.6 12/06/2020 0357   RBC 4.83 12/06/2020 0357   HGB 14.6 12/06/2020 0357   HCT 43.0 12/06/2020 0357   PLT 264 12/06/2020 0357   MCV 89.0 12/06/2020 0357   MCH 30.2 12/06/2020 0357   MCHC 34.0 12/06/2020 0357   RDW 12.2 12/06/2020 0357   LYMPHSABS 2.4 09/27/2017 1835   MONOABS 1.8 (H) 09/27/2017 1835   EOSABS 0.2 09/27/2017 1835   BASOSABS 0.1 09/27/2017 1835   Normal Hg     Assessment & Plan:   Discussion: 60 year old male former smoker (22 pack-years) who presents with one day history of hemoptysis and abnormal CT chest. Hemoptysis has self resolved. We reviewed CT scan. Scattered stable nodules in the apices. 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 Will re-evaluate in one month.  Subsolid nodular opacities with ground glass Shortness of breath, mild cough --ORDER CT chest without contrast in one month --Encourage regular  aerobic activity 30 minutes 3-5 times a week --Take zyrtec 10 mg daily during allergy seasons  Hemoptysis, likely sporadic bleed from small vessels or bronchitis --Self-resolved. No further work-up needed  Health Maintenance Immunization History  Administered Date(s) Administered   Td 04/11/1997, 10/05/2009   CT Lung Screen - eligible for CT lung screening. Will enroll at next visit  Orders Placed This Encounter  Procedures   CT Chest Wo Contrast    After Thanksgiving    Standing Status:   Future    Standing Expiration Date:   02/01/2022    Order Specific Question:   Preferred imaging location?    Answer:   [place]   No orders of the defined types were placed in this encounter.   Return in about 1 month (around 03/04/2021).  I have spent a total time of 45-minutes on the day of the appointment reviewing  prior documentation, coordinating care and discussing medical diagnosis and plan with the patient/family. Imaging, labs and tests included in this note have been reviewed and interpreted independently by me.  Javen Ridings Mechele Collin, MD [place] [facility] 02/01/2021 9:35 AM  Office Number [phone]

## 2021-02-01 NOTE — Patient Instructions (Signed)
Subsolid nodular opacities with ground glass Shortness of breath, mild cough --ORDER CT chest without contrast in one month --Encourage regular aerobic activity 30 minutes 3-5 times a week --Take zyrtec 10 mg daily during allergy seasons  Hemoptysis, likely sporadic bleed from small vessels or bronchitis --Self-resolved. No further work-up needed  Follow-up with me in one month

## 2021-03-08 ENCOUNTER — Other Ambulatory Visit: Payer: 59

## 2021-03-10 ENCOUNTER — Ambulatory Visit: Payer: 59 | Admitting: Pulmonary Disease

## 2021-03-10 NOTE — Progress Notes (Deleted)
Subjective:   PATIENT ID: Donald Osborne GENDER: male DOB: 03-17-1961, MRN: [id number]   HPI  No chief complaint on file.   Reason for Visit: Follow-up shortness of breath and hx single episode of hemoptysis  Mr. Donald Osborne is a 60 year old male former smoker with HTN, hypothyroidism who presents as a new consult.  Synopsis Presented as new consult for hemoptysis. He was seen in the ED on 12/06/20 for one day of hemoptysis. 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 CT chest demonstrated pumonary nodules. He was treated with prednisone ad referred to Pulmonary.  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 Attributes to being out of shape. Still able to complete his tasks. Denies wheezing and coughing. He sometimes has productive cough with clear sputum associated with allergies  03/10/21 He was unable to complete CT Chest  Social History: Currently social smoker Previously smoked 22 years x 1ppd. 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  Past Medical History:  Diagnosis Date   Hypertension    Hypothyroidism    Thyroid disease      Family History  Problem Relation Age of Onset   Hypertension Other      Social History   Occupational History   Not on file  Tobacco Use   Smoking status: Some Days    Packs/day: 1.00    Years: 22.00    Pack years: 22.00    Types: Cigarettes   Smokeless tobacco: Never  Substance and Sexual Activity   Alcohol use: Yes    Comment: 6-12 beers/day   Drug use: Yes    Types: Marijuana    Comment: occasional   Sexual activity: Not on file    No Known Allergies   Outpatient Medications Prior to Visit  Medication Sig Dispense Refill   acetaminophen (TYLENOL) 500 MG tablet Take 2 tablets (1,000 mg total) by  mouth every 6 (six) hours as needed for mild pain. 30 tablet 0   benzonatate (TESSALON) 100 MG capsule Take 1 capsule (100 mg total) by mouth every 8 (eight) hours. (Patient not taking: Reported on 02/01/2021) 21 capsule 0   buPROPion (WELLBUTRIN SR) 150 MG 12 hr tablet Take 150 mg by mouth 2 (two) times daily.     ciprofloxacin (CIPRO) 500 MG tablet Take 1 tablet (500 mg total) by mouth 2 (two) times daily. (Patient not taking: Reported on 02/01/2021) 10 tablet 0   docusate sodium (COLACE) 100 MG capsule Take 1 capsule (100 mg total) by mouth 2 (two) times daily. (Patient not taking: Reported on 02/01/2021) 10 capsule 0   levothyroxine (SYNTHROID, LEVOTHROID) 125 MCG tablet Take 125 mcg by mouth daily before breakfast.     losartan (COZAAR) 50 MG tablet Take 50 mg by mouth daily.     omeprazole (PRILOSEC) 20 MG capsule Take 20 mg by mouth daily as needed (acid reflux).     oxyCODONE (OXY IR/ROXICODONE) 5 MG immediate release tablet Take 1 tablet (5 mg total) by mouth every 6 (six) hours as needed for moderate pain. (Patient not taking: Reported on 02/01/2021) 15 tablet 0   polyethylene glycol (MIRALAX / GLYCOLAX) packet Take 17 g by mouth daily. (Patient not taking: Reported on 02/01/2021) 14 each 0   predniSONE (DELTASONE) 50 MG tablet 1 p.o. daily  x5 (Patient not taking: Reported on 02/01/2021) 5 tablet 0   No facility-administered medications prior to visit.    Review of Systems  Constitutional:  Negative for chills, diaphoresis, fever, malaise/fatigue and weight loss.  HENT:  Negative for congestion, ear pain and sore throat.   Respiratory:  Positive for cough, sputum production and shortness of breath. Negative for hemoptysis and wheezing.   Cardiovascular:  Negative for chest pain, palpitations and leg swelling.  Gastrointestinal:  Positive for heartburn. Negative for abdominal pain and nausea.  Genitourinary:  Negative for frequency.  Musculoskeletal:  Negative for joint pain and  myalgias.  Skin:  Negative for itching and rash.  Neurological:  Negative for dizziness, weakness and headaches.  Endo/Heme/Allergies:  Does not bruise/bleed easily.  Psychiatric/Behavioral:  Negative for depression. The patient is not nervous/anxious.     Objective:   There were no vitals filed for this visit.    Physical Exam: General: Well-appearing, no acute distress HENT: [place], AT Eyes: EOMI, no scleral icterus Respiratory: Clear to auscultation bilaterally.  No crackles, wheezing or rales Cardiovascular: RRR, -M/R/G, no JVD Extremities:-Edema,-tenderness Neuro: AAO x4, CNII-XII grossly intact Psych: Normal mood, normal affect   Data Reviewed:  Imaging: CTA 12/06/20 - bilateral lower and middle lob subsolid nodular GGO, right apical nodule measuring 4.5 mccstable since 2006. Subpleural nodules in apices that are stable.   PFT: None on file  Labs: CBC    Component Value Date/Time   WBC 6.6 12/06/2020 0357   RBC 4.83 12/06/2020 0357   HGB 14.6 12/06/2020 0357   HCT 43.0 12/06/2020 0357   PLT 264 12/06/2020 0357   MCV 89.0 12/06/2020 0357   MCH 30.2 12/06/2020 0357   MCHC 34.0 12/06/2020 0357   RDW 12.2 12/06/2020 0357   LYMPHSABS 2.4 09/27/2017 1835   MONOABS 1.8 (H) 09/27/2017 1835   EOSABS 0.2 09/27/2017 1835   BASOSABS 0.1 09/27/2017 1835   Normal Hg on 12/01/20 - 14.6     Assessment & Plan:   Discussion: 60 year old male former smoker (22 pack-years) who presents with one day history of hemoptysis and abnormal CT chest. Hemoptysis has self resolved. We reviewed CT scan. Scattered stable nodules in the apices. 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 Will re-evaluate in one month.  60 year old male former smoker (22 pack-years) who presents for follow-up. Initially seen for one day history of hemoptysis which has since resolved***. Repeat CT ordered for subsolid nodular opacities with GGO however  not completed.   Subsolid nodular opacities with ground glass Shortness of breath, mild cough --ORDER CT chest without contrast in one month --Encourage regular aerobic activity 30 minutes 3-5 times a week --Take zyrtec 10 mg daily during allergy seasons  Hemoptysis, likely sporadic bleed from small vessels or bronchitis --Self-resolved. No further work-up needed  Health Maintenance Immunization History  Administered Date(s) Administered   Td 04/11/1997, 10/05/2009   CT Lung Screen - eligible for CT lung screening. Will enroll at next visit  No orders of the defined types were placed in this encounter.  No orders of the defined types were placed in this encounter.   No follow-ups on file.  I have spent a total time of 45-minutes on the day of the appointment reviewing prior documentation, coordinating care and discussing medical diagnosis and plan with the patient/family. Imaging, labs and tests included in this note have been reviewed and interpreted independently by me.  Caren Garske  Mechele Collin, MD [facility] 03/10/2021 9:56 AM  Office Number [phone]

## 2022-03-11 ENCOUNTER — Ambulatory Visit: Payer: 59 | Admitting: Podiatry

## 2022-03-11 ENCOUNTER — Ambulatory Visit (INDEPENDENT_AMBULATORY_CARE_PROVIDER_SITE_OTHER): Payer: 59

## 2022-03-11 ENCOUNTER — Encounter: Payer: Self-pay | Admitting: Podiatry

## 2022-03-11 DIAGNOSIS — M779 Enthesopathy, unspecified: Secondary | ICD-10-CM

## 2022-03-11 DIAGNOSIS — M2021 Hallux rigidus, right foot: Secondary | ICD-10-CM

## 2022-03-11 NOTE — Progress Notes (Signed)
Subjective:   Patient ID: Donald Osborne, male   DOB: 61 y.o.   MRN: [id number]   HPI Patient states he thinks he traumatized his right big toe years ago and the joints been painful and gradually getting worse over the last couple years.  Especially hurts when he walks and tries to be active the left ones not to the same degree but he is just starting to notice it and states that the right 1 is also growing in size.  Patient does not smoke at this time and tries to be active   Review of Systems  All other systems reviewed and are negative.       Objective:  Physical Exam Vitals and nursing note reviewed.  Constitutional:      Appearance: He is well-developed.  Pulmonary:     Effort: Pulmonary effort is normal.  Musculoskeletal:        General: Normal range of motion.  Skin:    General: Skin is warm.  Neurological:     Mental Status: He is alert.     Neurovascular status intact muscle strength adequate range of motion within normal limits with patient found to have prominent first metatarsal head right with narrowing of the joint surface and bone spur formation with reduced range of motion pain within the joint surface.  Patient's tried wider shoes has tried rigid bottom shoes has tried soaking the area and oral anti-inflammatories and the left is mildly tender.  Patient has good digital perfusion well-oriented x 3     Assessment:  Inflammatory capsulitis first MPJ right with hallux limitus deformity and structural changes along with structural bunion deformity and mild discomfort left     Plan:  H&P x-rays reviewed great length and I have recommended due to the way the right foot is presenting that most likely surgery will be necessary.  I did discuss periarticular injection for short-term patient wants it fixed and is interested in getting this done as soon as possible due to his schedule.  At this point I did allow him to read the consent form going over at great length  alternative treatments complications and the fact there is no guarantee as to the condition of the joint and may ultimately require fusion or joint implantation.  Patient understands total recovery will take approximate 6 months and that there is no long-term guarantees that we can regain health of the joint.  Patient also will do well and orthotics long-term but not at this point and patient was dispensed air fracture walker fitted properly to his lower leg all questions answered and schedule surgery encouraged to call questions which may come  X-rays indicate that there is elevation of the 1 2 metatarsal angle right of approximate 15 degrees along with bone spur formation on the hallux and on the big toe joint with narrowing of the joint surface.  Left shows mild elevation not to the same degree

## 2022-03-14 ENCOUNTER — Telehealth: Payer: Self-pay | Admitting: Podiatry

## 2022-03-14 NOTE — Telephone Encounter (Addendum)
DOS: 03/29/2022  [employer] Effective 04/11/2021  [place] Bunionectomy Bi-Planar Rt [phone])  Deductible: $750 with $750 remaining Out-of-Pocket: $2,500 with $1,854.99 remaining CoInsurance: 0%  Authorization #: [id number] Authorization Valid: 03/29/2022 - 06/27/2022

## 2022-03-28 ENCOUNTER — Telehealth: Payer: Self-pay | Admitting: Podiatry

## 2022-03-28 NOTE — Telephone Encounter (Signed)
Called patient and asked if he wanted to go ahead and reschedule his surgery that he cancelled for tomorrow, 03/29/22. Patient rescheduled his surgery to Tuesday, 04/19/2022. Aram Beecham has been notified as well.

## 2022-03-28 NOTE — Telephone Encounter (Signed)
I've got surgery scheduled for tomorrow that I need to postpone/reschedule. Please give me a call back. Patient contacted [facility] to cancel his surgery and Aram Beecham is aware also.

## 2022-04-06 ENCOUNTER — Other Ambulatory Visit: Payer: 59

## 2022-04-14 ENCOUNTER — Telehealth: Payer: Self-pay

## 2022-04-14 NOTE — Telephone Encounter (Signed)
Leonides called to cancel his surgery with Dr. Paulla Dolly on 04/19/2022. He stated he has a conflict with work. He stated he will call me back to reschedule. Notified Dr. Paulla Dolly and Caren Griffins with [facility]

## 2022-04-20 ENCOUNTER — Other Ambulatory Visit: Payer: 59

## 2022-04-27 ENCOUNTER — Other Ambulatory Visit: Payer: 59

## 2022-05-11 ENCOUNTER — Other Ambulatory Visit: Payer: 59

## 2022-05-27 ENCOUNTER — Other Ambulatory Visit: Payer: Self-pay | Admitting: Family Medicine

## 2022-05-27 DIAGNOSIS — K862 Cyst of pancreas: Secondary | ICD-10-CM

## 2022-06-13 MED ORDER — ONDANSETRON HCL 4 MG PO TABS: 4.0000 mg | ORAL_TABLET | Freq: Three times a day (TID) | ORAL | 0 refills | Status: DC | PRN

## 2022-06-13 MED ORDER — OXYCODONE-ACETAMINOPHEN 10-325 MG PO TABS: 1.0000 | ORAL_TABLET | ORAL | 0 refills | Status: DC | PRN

## 2022-06-13 NOTE — Addendum Note (Signed)
Addended by: Wallene Huh on: 06/13/2022 03:44 PM   Modules accepted: Orders

## 2022-06-15 ENCOUNTER — Telehealth: Payer: Self-pay

## 2022-06-15 NOTE — Telephone Encounter (Signed)
Received email from [facility] on 06/09/2022 stating the patient requested to cancel his surgery with Dr. Paulla Dolly on 06/14/2022 due to not being about to get time off work. I left a message on 06/09/2022 and 06/13/2022 to confirm cancellation, no return call.

## 2022-06-20 ENCOUNTER — Encounter: Payer: 59 | Admitting: Podiatry

## 2022-06-20 ENCOUNTER — Ambulatory Visit: Payer: 59

## 2022-06-21 ENCOUNTER — Ambulatory Visit: Payer: 59

## 2022-06-27 ENCOUNTER — Ambulatory Visit
Admission: RE | Admit: 2022-06-27 | Discharge: 2022-06-27 | Disposition: A | Discharge status: Home / Self Care | Payer: 59 | Source: Ambulatory Visit | Attending: Family Medicine | Admitting: Family Medicine

## 2022-06-27 DIAGNOSIS — K862 Cyst of pancreas: Secondary | ICD-10-CM

## 2022-06-27 MED ORDER — GADOPICLENOL 0.5 MMOL/ML IV SOLN
9.0000 mL | Freq: Once | INTRAVENOUS | Status: AC | PRN
  Administered 2022-06-27: 9 mL via INTRAVENOUS

## 2022-07-04 ENCOUNTER — Ambulatory Visit: Payer: 59

## 2022-07-05 ENCOUNTER — Ambulatory Visit: Payer: 59

## 2023-04-28 ENCOUNTER — Other Ambulatory Visit: Payer: Self-pay | Admitting: Orthopaedic Surgery

## 2023-05-06 NOTE — Patient Instructions (Signed)
SURGICAL WAITING ROOM VISITATION Patients having surgery or a procedure may have no more than 2 support people in the waiting area - these visitors may rotate in the visitor waiting room.   Due to an increase in RSV and influenza rates and associated hospitalizations, children ages 51 and under may not visit patients in [facility] hospitals. If the patient needs to stay at the hospital during part of their recovery, the visitor guidelines for inpatient rooms apply.  PRE-OP VISITATION  Pre-op nurse will coordinate an appropriate time for 1 support person to accompany the patient in pre-op.  This support person may not rotate.  This visitor will be contacted when the time is appropriate for the visitor to come back in the pre-op area.  Please refer to the [facility] website for the visitor guidelines for Inpatients (after your surgery is over and you are in a regular room).  You are not required to quarantine at this time prior to your surgery. However, you must do this: Hand Hygiene often Do NOT share personal items Notify your provider if you are in close contact with someone who has COVID or you develop fever 100.4 or greater, new onset of sneezing, cough, sore throat, shortness of breath or body aches.  If you test positive for Covid or have been in contact with anyone that has tested positive in the last 10 days please notify you surgeon.    Your procedure is scheduled on:  Tuesday  May 23, 2023  Report to [facility] Main Entrance: [facility] entrance where the [employer] is available.   Report to admitting at: 05:15    AM  Call this number if you have any questions or problems the morning of surgery [phone]  Do not eat food after Midnight the night prior to your surgery/procedure.  After Midnight you may have the following liquids until  04:15  AM DAY OF SURGERY  Clear Liquid Diet Water Black Coffee (sugar ok, NO MILK/CREAM OR CREAMERS)  Tea (sugar ok, NO  MILK/CREAM OR CREAMERS) regular and decaf                             Plain Jell-O  with no fruit (NO RED)                                           Fruit ices (not with fruit pulp, NO RED)                                     Popsicles (NO RED)                                                                  Juice: NO CITRUS JUICES: only apple, [place] grape, [place] cranberry Sports drinks like Gatorade or Powerade (NO RED)                   The day of surgery:  Drink ONE (1) Pre-Surgery Clear Ensure at  04:15 AM the morning of surgery.  Drink in one sitting. Do not sip.  This drink was given to you during your hospital pre-op appointment visit. Nothing else to drink after completing the Pre-Surgery Clear Ensure : No candy, chewing gum or throat lozenges.    FOLLOW ANY ADDITIONAL PRE OP INSTRUCTIONS YOU RECEIVED FROM YOUR SURGEON'S OFFICE!!!   Oral Hygiene is also important to reduce your risk of infection.        Remember - BRUSH YOUR TEETH THE MORNING OF SURGERY WITH YOUR REGULAR TOOTHPASTE  Do NOT smoke after Midnight the night before surgery.  STOP TAKING all Vitamins, Herbs and supplements 1 week before your surgery.   Take ONLY these medicines the morning of surgery with A SIP OF WATER: Omeprazole, escitalopram (Lexapro), levothyroxine, bupropion (Wellbutrin)                    You may not have any metal on your body including  jewelry, and body piercing  Do not wear  lotions, powders, cologne, or deodorant  Men may shave face and neck.  Contacts, Hearing Aids, dentures or bridgework may not be worn into surgery. DENTURES WILL BE REMOVED PRIOR TO SURGERY PLEASE DO NOT APPLY "Poly grip" OR ADHESIVES!!!  Patients discharged on the day of surgery will not be allowed to drive home.  Someone NEEDS to stay with you for the first 24 hours after anesthesia.  Do not bring your home medications to the hospital. The Pharmacy will dispense medications listed on your medication list to you  during your admission in the Hospital.  Please read over the following fact sheets you were given: IF YOU HAVE QUESTIONS ABOUT YOUR PRE-OP INSTRUCTIONS, PLEASE CALL [phone]   [facility] - Preparing for Surgery Before surgery, you can play an important role.  Because skin is not sterile, your skin needs to be as free of germs as possible.  You can reduce the number of germs on your skin by washing with CHG (chlorahexidine gluconate) soap before surgery.  CHG is an antiseptic cleaner which kills germs and bonds with the skin to continue killing germs even after washing. Please DO NOT use if you have an allergy to CHG or antibacterial soaps.  If your skin becomes reddened/irritated stop using the CHG and inform your nurse when you arrive at Short Stay. Do not shave (including legs and underarms) for at least 48 hours prior to the first CHG shower.  You may shave your face/neck.  Please follow these instructions carefully:  1.  Shower with CHG Soap the night before surgery and the  morning of surgery.  2.  If you choose to wash your hair, wash your hair first as usual with your normal  shampoo.  3.  After you shampoo, rinse your hair and body thoroughly to remove the shampoo.                             4.  Use CHG as you would any other liquid soap.  You can apply chg directly to the skin and wash.  Gently with a scrungie or clean washcloth.  5.  Apply the CHG Soap to your body ONLY FROM THE NECK DOWN.   Do not use on face/ open                           Wound or open sores. Avoid contact with eyes, ears mouth and genitals (  private parts).                       Wash face,  Genitals (private parts) with your normal soap.             6.  Wash thoroughly, paying special attention to the area where your  surgery  will be performed.  7.  Thoroughly rinse your body with warm water from the neck down.  8.  DO NOT shower/wash with your normal soap after using and rinsing off the CHG Soap.             9.  Pat yourself dry with a clean towel.            10.  Wear clean pajamas.            11.  Place clean sheets on your bed the night of your first shower and do not  sleep with pets.  ON THE DAY OF SURGERY : Do not apply any lotions/deodorants the morning of surgery.  Please wear clean clothes to the hospital/surgery center.     FAILURE TO FOLLOW THESE INSTRUCTIONS MAY RESULT IN THE CANCELLATION OF YOUR SURGERY  PATIENT SIGNATURE_________________________________  NURSE SIGNATURE__________________________________  ________________________________________________________________________     Rogelia Mire    An incentive spirometer is a tool that can help keep your lungs clear and active. This tool measures how well you are filling your lungs with each breath. Taking long deep breaths may help reverse or decrease the chance of developing breathing (pulmonary) problems (especially infection) following: A long period of time when you are unable to move or be active. BEFORE THE PROCEDURE  If the spirometer includes an indicator to show your best effort, your nurse or respiratory therapist will set it to a desired goal. If possible, sit up straight or lean slightly forward. Try not to slouch. Hold the incentive spirometer in an upright position. INSTRUCTIONS FOR USE  Sit on the edge of your bed if possible, or sit up as far as you can in bed or on a chair. Hold the incentive spirometer in an upright position. Breathe out normally. Place the mouthpiece in your mouth and seal your lips tightly around it. Breathe in slowly and as deeply as possible, raising the piston or the ball toward the top of the column. Hold your breath for 3-5 seconds or for as long as possible. Allow the piston or ball to fall to the bottom of the column. Remove the mouthpiece from your mouth and breathe out normally. Rest for a few seconds and repeat Steps 1 through 7 at least 10 times every 1-2 hours  when you are awake. Take your time and take a few normal breaths between deep breaths. The spirometer may include an indicator to show your best effort. Use the indicator as a goal to work toward during each repetition. After each set of 10 deep breaths, practice coughing to be sure your lungs are clear. If you have an incision (the cut made at the time of surgery), support your incision when coughing by placing a pillow or rolled up towels firmly against it. Once you are able to get out of bed, walk around indoors and cough well. You may stop using the incentive spirometer when instructed by your caregiver.  RISKS AND COMPLICATIONS Take your time so you do not get dizzy or light-headed. If you are in pain, you may need to take or ask for pain medication before  doing incentive spirometry. It is harder to take a deep breath if you are having pain. AFTER USE Rest and breathe slowly and easily. It can be helpful to keep track of a log of your progress. Your caregiver can provide you with a simple table to help with this. If you are using the spirometer at home, follow these instructions: SEEK MEDICAL CARE IF:  You are having difficultly using the spirometer. You have trouble using the spirometer as often as instructed. Your pain medication is not giving enough relief while using the spirometer. You develop fever of 100.5 F (38.1 C) or higher.                                                                                                    SEEK IMMEDIATE MEDICAL CARE IF:  You cough up bloody sputum that had not been present before. You develop fever of 102 F (38.9 C) or greater. You develop worsening pain at or near the incision site. MAKE SURE YOU:  Understand these instructions. Will watch your condition. Will get help right away if you are not doing well or get worse. Document Released: 08/08/2006 Document Revised: 06/20/2011 Document Reviewed: 10/09/2006 [facility] Patient Information  2014 [place], [place].

## 2023-05-06 NOTE — Progress Notes (Signed)
COVID Vaccine received:  [x]  No []  Yes Date of any COVID positive Test in last 90 days:  none  PCP - Farris Has, MD at [facility]  [phone]  Cardiologist - None   Chest x-ray - 12-06-2020   2v  Epic EKG - 12-07-2020  Epic  Stress Test -  ECHO -  Cardiac Cath -   PCR screen: []  Ordered & Completed []   No Order but Needs PROFEND     [x]   N/A for this surgery  Surgery Plan:  [x]  Ambulatory   []  Outpatient in bed  []  Admit Anesthesia:    []  General  []  Spinal  [x]   Choice []   MAC  Pacemaker / ICD device [x]  No []  Yes   Spinal Cord Stimulator:[x]  No []  Yes       History of Sleep Apnea? [x]  No []  Yes   CPAP used?- [x]  No []  Yes    Does the patient monitor blood sugar?   [x]  N/A   []  No []  Yes  Patient has: [x]  NO Hx DM   []  Pre-DM   []  DM1  []   DM2  Blood Thinner / Instructions:  none Aspirin Instructions:  none  ERAS Protocol Ordered: []  No  [x]  Yes PRE-SURGERY [x]  ENSURE  []  G2   Patient is to be NPO after: 0415  Dental hx: []  Dentures:  [x]  N/A      []  Bridge or Partial:                   []  Loose or Damaged teeth:   Activity level: Patient is able to climb a flight of stairs without difficulty; [x]  No CP  [x]  No SOB, but would have foot pain..Patient can perform ADLs without assistance.   Anesthesia review: HTN, ADHD, GERD, anxiety,   Patient denies shortness of breath, fever, cough and chest pain at PAT appointment.  Patient verbalized understanding and agreement to the Pre-Surgical Instructions that were given to them at this PAT appointment. Patient was also educated of the need to review these PAT instructions again prior to his surgery.I reviewed the appropriate phone numbers to call if they have any and questions or concerns.

## 2023-05-08 ENCOUNTER — Other Ambulatory Visit: Payer: Self-pay

## 2023-05-08 ENCOUNTER — Encounter (HOSPITAL_COMMUNITY): Payer: Self-pay

## 2023-05-08 ENCOUNTER — Encounter (HOSPITAL_COMMUNITY)
Admission: RE | Admit: 2023-05-08 | Discharge: 2023-05-08 | Disposition: A | Discharge status: Home / Self Care | Payer: Medicaid Other | Source: Ambulatory Visit | Attending: Orthopaedic Surgery | Admitting: Orthopaedic Surgery

## 2023-05-08 VITALS — BP 128/80 | HR 66 | Temp 98.4°F | Resp 14 | Ht 72.0 in | Wt 202.0 lb

## 2023-05-08 DIAGNOSIS — Z01818 Encounter for other preprocedural examination: Secondary | ICD-10-CM | POA: Diagnosis present

## 2023-05-08 DIAGNOSIS — I10 Essential (primary) hypertension: Secondary | ICD-10-CM | POA: Diagnosis not present

## 2023-05-08 HISTORY — DX: Attention-deficit hyperactivity disorder, unspecified type: F90.9

## 2023-05-08 HISTORY — DX: Gastro-esophageal reflux disease without esophagitis: K21.9

## 2023-05-08 LAB — BASIC METABOLIC PANEL
Anion gap: 9 (ref 5–15)
BUN: 6 mg/dL (ref 6–20)
CO2: 26 mmol/L (ref 22–32)
Calcium: 9.2 mg/dL (ref 8.9–10.3)
Chloride: 96 mmol/L — ABNORMAL LOW (ref 98–111)
Creatinine, Ser: 0.85 mg/dL (ref 0.61–1.24)
GFR, Estimated: >60 mL/min (ref >60)
Glucose, Bld: 95 mg/dL (ref 70–99)
Potassium: 5.5 mmol/L — ABNORMAL HIGH (ref 3.5–5.1)
Sodium: 131 mmol/L — ABNORMAL LOW (ref 135–145)

## 2023-05-08 LAB — CBC
HCT: 41.1 % (ref 39.0–52.0)
Hemoglobin: 13.9 g/dL (ref 13.0–17.0)
MCH: 30.5 pg (ref 26.0–34.0)
MCHC: 33.8 g/dL (ref 30.0–36.0)
MCV: 90.1 fL (ref 80.0–100.0)
Platelets: 212 10*3/uL (ref 150–400)
RBC: 4.56 MIL/uL (ref 4.22–5.81)
RDW: 13.3 % (ref 11.5–15.5)
WBC: 5.2 10*3/uL (ref 4.0–10.5)
nRBC: 0 % (ref 0.0–0.2)

## 2023-05-22 NOTE — Anesthesia Preprocedure Evaluation (Addendum)
Anesthesia Evaluation  Patient identified by MRN, date of birth, ID band Patient awake    Reviewed: Allergy & Precautions, NPO status , Patient's Chart, lab work & pertinent test results  Airway Mallampati: II  TM Distance: >3 FB Neck ROM: Full    Dental  (+) Dental Advisory Given   Pulmonary former smoker 22 pack year history    breath sounds clear to auscultation       Cardiovascular hypertension, Pt. on medications  Rhythm:Regular Rate:Normal     Neuro/Psych negative neurological ROS  negative psych ROS   GI/Hepatic Neg liver ROS,GERD  Controlled and Medicated,,  Endo/Other  Hypothyroidism    Renal/GU negative Renal ROS  negative genitourinary   Musculoskeletal negative musculoskeletal ROS (+)    Abdominal   Peds  Hematology negative hematology ROS (+)   Anesthesia Other Findings   Reproductive/Obstetrics negative OB ROS                             Anesthesia Physical Anesthesia Plan  ASA: 2  Anesthesia Plan: General and Regional   Post-op Pain Management: Regional block*, Tylenol PO (pre-op)* and Toradol IV (intra-op)*   Induction: Intravenous  PONV Risk Score and Plan: 2 and Ondansetron, Dexamethasone, Midazolam and Treatment may vary due to age or medical condition  Airway Management Planned: LMA  Additional Equipment: None  Intra-op Plan:   Post-operative Plan: Extubation in OR  Informed Consent: I have reviewed the patients History and Physical, chart, labs and discussed the procedure including the risks, benefits and alternatives for the proposed anesthesia with the patient or authorized representative who has indicated his/her understanding and acceptance.     Dental advisory given  Plan Discussed with:   Anesthesia Plan Comments:         Anesthesia Quick Evaluation

## 2023-05-23 ENCOUNTER — Encounter (HOSPITAL_COMMUNITY): Payer: Self-pay | Admitting: Orthopaedic Surgery

## 2023-05-23 ENCOUNTER — Ambulatory Visit (HOSPITAL_COMMUNITY)
Admission: RE | Admit: 2023-05-23 | Discharge: 2023-05-23 | Disposition: A | Discharge status: Home / Self Care | Payer: Medicaid Other | Attending: Orthopaedic Surgery | Admitting: Orthopaedic Surgery

## 2023-05-23 ENCOUNTER — Ambulatory Visit (HOSPITAL_COMMUNITY): Payer: Self-pay | Admitting: Anesthesiology

## 2023-05-23 ENCOUNTER — Other Ambulatory Visit: Payer: Self-pay

## 2023-05-23 ENCOUNTER — Ambulatory Visit (HOSPITAL_BASED_OUTPATIENT_CLINIC_OR_DEPARTMENT_OTHER): Payer: Medicaid Other | Admitting: Anesthesiology

## 2023-05-23 ENCOUNTER — Encounter (HOSPITAL_COMMUNITY)
Admission: RE | Disposition: A | Discharge status: Home / Self Care | Payer: Self-pay | Source: Home / Self Care | Attending: Orthopaedic Surgery

## 2023-05-23 ENCOUNTER — Other Ambulatory Visit (HOSPITAL_COMMUNITY): Payer: Self-pay

## 2023-05-23 DIAGNOSIS — Z87891 Personal history of nicotine dependence: Secondary | ICD-10-CM | POA: Diagnosis not present

## 2023-05-23 DIAGNOSIS — M21611 Bunion of right foot: Secondary | ICD-10-CM | POA: Diagnosis not present

## 2023-05-23 DIAGNOSIS — K219 Gastro-esophageal reflux disease without esophagitis: Secondary | ICD-10-CM | POA: Insufficient documentation

## 2023-05-23 DIAGNOSIS — M19071 Primary osteoarthritis, right ankle and foot: Secondary | ICD-10-CM | POA: Insufficient documentation

## 2023-05-23 DIAGNOSIS — I10 Essential (primary) hypertension: Secondary | ICD-10-CM | POA: Diagnosis not present

## 2023-05-23 DIAGNOSIS — M2021 Hallux rigidus, right foot: Secondary | ICD-10-CM

## 2023-05-23 HISTORY — PX: ARTHRODESIS METATARSALPHALANGEAL JOINT (MTPJ): SHX6566

## 2023-05-23 LAB — POTASSIUM: Potassium: 3.9 mmol/L (ref 3.5–5.1)

## 2023-05-23 SURGERY — FUSION, JOINT, GREAT TOE
Anesthesia: Regional | Site: Toe | Laterality: Right

## 2023-05-23 MED ORDER — MIDAZOLAM HCL 2 MG/2ML IJ SOLN
INTRAMUSCULAR | Status: AC
  Filled 2023-05-23: qty 2

## 2023-05-23 MED ORDER — CLONIDINE HCL (ANALGESIA) 100 MCG/ML EP SOLN
EPIDURAL | Status: DC | PRN
  Administered 2023-05-23 (×2): 50 ug

## 2023-05-23 MED ORDER — ORAL CARE MOUTH RINSE: 15.0000 mL | Freq: Once | OROMUCOSAL | Status: AC

## 2023-05-23 MED ORDER — OXYCODONE HCL 5 MG PO TABS
5.0000 mg | ORAL_TABLET | ORAL | 0 refills | Status: AC | PRN
  Filled 2023-05-23: qty 30, 5d supply, fill #0

## 2023-05-23 MED ORDER — PROPOFOL 10 MG/ML IV BOLUS
INTRAVENOUS | Status: AC
  Filled 2023-05-23: qty 20

## 2023-05-23 MED ORDER — FENTANYL CITRATE (PF) 100 MCG/2ML IJ SOLN
INTRAMUSCULAR | Status: DC | PRN
Start: 2023-05-23 — End: 2023-05-23
  Administered 2023-05-23 (×2): 50 ug via INTRAVENOUS

## 2023-05-23 MED ORDER — ROPIVACAINE HCL 5 MG/ML IJ SOLN
INTRAMUSCULAR | Status: DC | PRN
  Administered 2023-05-23: 20 mL via PERINEURAL

## 2023-05-23 MED ORDER — ONDANSETRON HCL 4 MG/2ML IJ SOLN
INTRAMUSCULAR | Status: DC | PRN
  Administered 2023-05-23: 4 mg via INTRAVENOUS

## 2023-05-23 MED ORDER — OXYCODONE HCL 5 MG PO TABS: 5.0000 mg | ORAL_TABLET | Freq: Once | ORAL | Status: DC | PRN

## 2023-05-23 MED ORDER — CHLORHEXIDINE GLUCONATE 0.12 % MT SOLN
15.0000 mL | Freq: Once | OROMUCOSAL | Status: AC
  Administered 2023-05-23: 15 mL via OROMUCOSAL

## 2023-05-23 MED ORDER — EPHEDRINE SULFATE (PRESSORS) 50 MG/ML IJ SOLN
INTRAMUSCULAR | Status: DC | PRN
  Administered 2023-05-23 (×5): 5 mg via INTRAVENOUS

## 2023-05-23 MED ORDER — MEPERIDINE HCL 50 MG/ML IJ SOLN: 6.2500 mg | INTRAMUSCULAR | Status: DC | PRN

## 2023-05-23 MED ORDER — BUPIVACAINE-EPINEPHRINE (PF) 0.5% -1:200000 IJ SOLN
INTRAMUSCULAR | Status: DC | PRN
  Administered 2023-05-23: 30 mL via PERINEURAL

## 2023-05-23 MED ORDER — OXYCODONE HCL 5 MG/5ML PO SOLN: 5.0000 mg | Freq: Once | ORAL | Status: DC | PRN

## 2023-05-23 MED ORDER — CEFAZOLIN SODIUM-DEXTROSE 2-4 GM/100ML-% IV SOLN
2.0000 g | INTRAVENOUS | Status: AC
Start: 2023-05-23 — End: 2023-05-23
  Administered 2023-05-23: 2 g via INTRAVENOUS
  Filled 2023-05-23: qty 100

## 2023-05-23 MED ORDER — LIDOCAINE HCL (CARDIAC) PF 100 MG/5ML IV SOSY
PREFILLED_SYRINGE | INTRAVENOUS | Status: DC | PRN
  Administered 2023-05-23: 20 mg via INTRAVENOUS

## 2023-05-23 MED ORDER — ACETAMINOPHEN 500 MG PO TABS
1000.0000 mg | ORAL_TABLET | Freq: Once | ORAL | Status: AC
  Administered 2023-05-23: 1000 mg via ORAL
  Filled 2023-05-23: qty 2

## 2023-05-23 MED ORDER — HYDROMORPHONE HCL 1 MG/ML IJ SOLN: 0.2500 mg | INTRAMUSCULAR | Status: DC | PRN

## 2023-05-23 MED ORDER — PROPOFOL 10 MG/ML IV BOLUS
INTRAVENOUS | Status: DC | PRN
  Administered 2023-05-23: 200 mg via INTRAVENOUS

## 2023-05-23 MED ORDER — LACTATED RINGERS IV SOLN
INTRAVENOUS | Status: DC
Start: 2023-05-23 — End: 2023-05-23

## 2023-05-23 MED ORDER — FENTANYL CITRATE (PF) 100 MCG/2ML IJ SOLN
INTRAMUSCULAR | Status: AC
  Filled 2023-05-23: qty 2

## 2023-05-23 MED ORDER — GLYCOPYRROLATE 0.2 MG/ML IJ SOLN
INTRAMUSCULAR | Status: DC | PRN
  Administered 2023-05-23: .1 mg via INTRAVENOUS

## 2023-05-23 MED ORDER — 0.9 % SODIUM CHLORIDE (POUR BTL) OPTIME
TOPICAL | Status: DC | PRN
  Administered 2023-05-23: 1000 mL

## 2023-05-23 MED ORDER — KETOROLAC TROMETHAMINE 30 MG/ML IJ SOLN: 30.0000 mg | Freq: Once | INTRAMUSCULAR | Status: DC | PRN

## 2023-05-23 MED ORDER — ONDANSETRON HCL 4 MG/2ML IJ SOLN: 4.0000 mg | Freq: Once | INTRAMUSCULAR | Status: DC | PRN

## 2023-05-23 MED ORDER — MIDAZOLAM HCL 5 MG/5ML IJ SOLN
INTRAMUSCULAR | Status: DC | PRN
  Administered 2023-05-23 (×2): 1 mg via INTRAVENOUS

## 2023-05-23 MED ORDER — BUPIVACAINE HCL (PF) 0.5 % IJ SOLN
INTRAMUSCULAR | Status: AC
  Filled 2023-05-23: qty 30

## 2023-05-23 MED ORDER — PHENYLEPHRINE HCL-NACL 20-0.9 MG/250ML-% IV SOLN
INTRAVENOUS | Status: DC | PRN
  Administered 2023-05-23: 20 ug/min via INTRAVENOUS

## 2023-05-23 MED ORDER — AMISULPRIDE (ANTIEMETIC) 5 MG/2ML IV SOLN: 10.0000 mg | Freq: Once | INTRAVENOUS | Status: DC | PRN

## 2023-05-23 SURGICAL SUPPLY — 62 items
BIT DRILL 2 CANN SM BONE QF (BIT) IMPLANT
BIT DRILL CANN F/COMP 2.2 (BIT) IMPLANT
BLADE LONG MED 25X9 (BLADE) IMPLANT
BLADE OSC/SAG .038X5.5 CUT EDG (BLADE) IMPLANT
BLADE SURG 15 STRL LF DISP TIS (BLADE) ×4 IMPLANT
BNDG COHESIVE 4X5 TAN STRL LF (GAUZE/BANDAGES/DRESSINGS) IMPLANT
BNDG ELASTIC 4INX 5YD STR LF (GAUZE/BANDAGES/DRESSINGS) ×2 IMPLANT
BNDG ELASTIC 6INX 5YD STR LF (GAUZE/BANDAGES/DRESSINGS) IMPLANT
BNDG ESMARK 4X9 LF (GAUZE/BANDAGES/DRESSINGS) ×2 IMPLANT
CHLORAPREP W/TINT 26 (MISCELLANEOUS) ×2 IMPLANT
COVER BACK TABLE 60X90IN (DRAPES) ×2 IMPLANT
DRAPE EXTREMITY T 121X128X90 (DISPOSABLE) ×2 IMPLANT
DRAPE IMP U-DRAPE 54X76 (DRAPES) ×2 IMPLANT
DRAPE OEC MINIVIEW 54X84 (DRAPES) ×2 IMPLANT
DRAPE U-SHAPE 47X51 STRL (DRAPES) ×2 IMPLANT
ELECT REM PT RETURN 9FT ADLT (ELECTROSURGICAL) ×1 IMPLANT
ELECTRODE REM PT RTRN 9FT ADLT (ELECTROSURGICAL) ×2 IMPLANT
GAUZE SPONGE 4X4 12PLY STRL (GAUZE/BANDAGES/DRESSINGS) ×2 IMPLANT
GAUZE STRETCH 2X75IN STRL (MISCELLANEOUS) ×2 IMPLANT
GAUZE XEROFORM 1X8 LF (GAUZE/BANDAGES/DRESSINGS) ×2 IMPLANT
GLOVE BIOGEL M STRL SZ7.5 (GLOVE) ×4 IMPLANT
GLOVE BIOGEL PI IND STRL 8 (GLOVE) ×4 IMPLANT
GOWN STRL REUS W/ TWL LRG LVL3 (GOWN DISPOSABLE) ×2 IMPLANT
GOWN STRL REUS W/ TWL XL LVL3 (GOWN DISPOSABLE) ×4 IMPLANT
GUIDEWIRE .045IN 1.14MM (WIRE) IMPLANT
GUIDEWIRE ORTH 157X1.6XTROC (WIRE) IMPLANT
KIT BASIN OR (CUSTOM PROCEDURE TRAY) ×2 IMPLANT
NDL HYPO 22X1.5 SAFETY MO (MISCELLANEOUS) IMPLANT
NEEDLE HYPO 22X1.5 SAFETY MO (MISCELLANEOUS) IMPLANT
NS IRRIG 1000ML POUR BTL (IV SOLUTION) ×2 IMPLANT
PAD CAST 4YDX4 CTTN HI CHSV (CAST SUPPLIES) ×2 IMPLANT
PADDING CAST SYNTHETIC 4X4 STR (CAST SUPPLIES) IMPLANT
PENCIL SMOKE EVACUATOR (MISCELLANEOUS) ×2 IMPLANT
PIN BB TAK MTP SU (PIN) IMPLANT
PLATE MTP MAXFORCE STD RT 0D (Plate) IMPLANT
REAMER METATARSAL CUP 20MM (MISCELLANEOUS) IMPLANT
REAMER PHALANGEAL CONE 20MM (MISCELLANEOUS) IMPLANT
SCREW CANC QF PT 3X40 (Screw) IMPLANT
SCREW CORT LP HYBRID 3X18 NS (Screw) IMPLANT
SCREW CORTICAL 3.0X18 (Screw) IMPLANT
SCREW LOCK COMP 3X16 (Screw) IMPLANT
SCREW VAL KREULOCK 3.0X12 TI (Screw) IMPLANT
SCREW VAL KREULOCK 3.0X18 TI (Screw) IMPLANT
SCREW VAL KREULOCK 3X22 (Screw) IMPLANT
SHEET MEDIUM DRAPE 40X70 STRL (DRAPES) ×2 IMPLANT
SLEEVE SCD COMPRESS KNEE MED (STOCKING) ×2 IMPLANT
SPIKE FLUID TRANSFER (MISCELLANEOUS) IMPLANT
SPONGE T-LAP 18X18 ~~LOC~~+RFID (SPONGE) ×2 IMPLANT
STOCKINETTE 6 STRL (DRAPES) ×2 IMPLANT
SUCTION TUBE FRAZIER 10FR DISP (SUCTIONS) ×2 IMPLANT
SUT ETHILON 3 0 PS 1 (SUTURE) ×2 IMPLANT
SUT FIBERWIRE 2-0 18 17.9 3/8 (SUTURE) IMPLANT
SUT MNCRL AB 3-0 PS2 18 (SUTURE) ×2 IMPLANT
SUT PDS AB 2-0 CT2 27 (SUTURE) ×2 IMPLANT
SUT VIC AB 2-0 SH 27XBRD (SUTURE) IMPLANT
SUT VIC AB 3-0 FS2 27 (SUTURE) IMPLANT
SUTURE FIBERWR 2-0 18 17.9 3/8 (SUTURE) IMPLANT
SYR BULB EAR ULCER 3OZ GRN STR (SYRINGE) ×2 IMPLANT
SYR CONTROL 10ML LL (SYRINGE) IMPLANT
TOWEL GREEN STERILE FF (TOWEL DISPOSABLE) ×4 IMPLANT
TUBE CONNECTING 20X1/4 (TUBING) ×2 IMPLANT
UNDERPAD 30X36 HEAVY ABSORB (UNDERPADS AND DIAPERS) ×2 IMPLANT

## 2023-05-23 NOTE — H&P (Signed)
PREOPERATIVE H&P  Chief Complaint: Right foot pain  HPI: Donald Osborne is a 63 y.o. male who presents for preoperative history and physical with a diagnosis of right foot first MTP arthrosis with deformity.  He is here today for surgery.  He has failed conservative treatment.. Symptoms are rated as moderate to severe, and have been worsening.  This is significantly impairing activities of daily living.  He has elected for surgical management.   Past Medical History:  Diagnosis Date   ADHD (attention deficit hyperactivity disorder)    GERD (gastroesophageal reflux disease)    Hypertension    Hypothyroidism    Thyroid disease    Past Surgical History:  Procedure Laterality Date   INCISION AND DRAINAGE PERIRECTAL ABSCESS N/A 09/28/2017   Procedure: INCISION  AND DRAINAGE  PERIRECTAL ABSCESS; RECTAL EXAM UNDER ANESTHESIA;  Surgeon: Harriette Bouillon, MD;  Location: [facility] OR;  Service: General;  Laterality: N/A;   TONSILLECTOMY     Social History   Socioeconomic History   Marital status: Single    Spouse name: Not on file   Number of children: Not on file   Years of education: Not on file   Highest education level: Not on file  Occupational History   Not on file  Tobacco Use   Smoking status: Former    Average packs/day: 1 pack/day for 22.0 years (22.0 ttl pk-yrs)    Types: Cigarettes    Start date: 29   Smokeless tobacco: Never  Vaping Use   Vaping status: Never Used  Substance and Sexual Activity   Alcohol use: Yes    Comment: 6-12 beers/day   Drug use: Not Currently    Types: Marijuana    Comment: occasional   Sexual activity: Yes  Other Topics Concern   Not on file  Social History Narrative   Not on file   Social Drivers of Health   Financial Resource Strain: Not on file  Food Insecurity: Not on file  Transportation Needs: Not on file  Physical Activity: Not on file  Stress: Not on file  Social Connections: Not on file   Family History  Problem Relation Age of  Onset   Hypertension Other    No Known Allergies Prior to Admission medications   Medication Sig Start Date End Date Taking? Authorizing Provider  amphetamine-dextroamphetamine (ADDERALL) 30 MG tablet Take 15-30 mg by mouth 2 (two) times daily as needed (attention/focus).   Yes [provider]  buPROPion (WELLBUTRIN SR) 150 MG 12 hr tablet Take 150 mg by mouth 2 (two) times daily.   Yes [provider]  escitalopram (LEXAPRO) 10 MG tablet Take 10 mg by mouth in the morning.   Yes [provider]  levothyroxine (SYNTHROID) 200 MCG tablet Take 200 mcg by mouth daily before breakfast.   Yes [provider]  losartan (COZAAR) 100 MG tablet Take 100 mg by mouth in the morning. 12/04/20  Yes [provider]  omeprazole (PRILOSEC) 40 MG capsule Take 40 mg by mouth daily before breakfast.   Yes [provider]  tadalafil (CIALIS) 20 MG tablet Take 20 mg by mouth daily as needed for erectile dysfunction. 04/26/23   [provider]     Positive ROS: All other systems have been reviewed and were otherwise negative with the exception of those mentioned in the HPI and as above.  Physical Exam:  Vitals:   05/23/23 0539 05/23/23 0549  BP:  (!) 145/92  Pulse: 73   Resp: 16  Temp: 98.2 F (36.8 C)   SpO2: 96%    General: Alert, no acute distress Cardiovascular: No pedal edema Respiratory: No cyanosis, no use of accessory musculature GI: No organomegaly, abdomen is soft and non-tender Skin: No lesions in the area of chief complaint Neurologic: Sensation intact distally Psychiatric: Patient is competent for consent with normal mood and affect Lymphatic: No axillary or cervical lymphadenopathy  MUSCULOSKELETAL: Right foot demonstrates swelling at the hallux MTP joint.  Tender to palpation.  Stiffness noted.  Prominence noted overlying the dorsal aspect.  No other areas of tenderness.  Foot is warm and well-perfused with intact  sensation.  Assessment: Right foot first MTP arthrosis with osteophytosis and pain   Plan: Plan for right foot first MTP arthrodesis.  We discussed the risks, benefits and alternatives of surgery which include but are not limited to wound healing complications, infection, nonunion, malunion, need for further surgery, damage to surrounding structures and continued pain.  They understand there is no guarantees to an acceptable outcome.  After weighing these risks they opted to proceed with surgery.     Terance Hart, MD    05/23/2023 6:37 AM

## 2023-05-23 NOTE — Progress Notes (Signed)
Orthopedic Tech Progress Note Patient Details:  GABE GLACE 1961-02-28 [id number]  Ortho Devices Type of Ortho Device: Postop shoe/boot Ortho Device/Splint Location: right post op shoe applied in pacu Ortho Device/Splint Interventions: Ordered, Application, Adjustment   Post Interventions Patient Tolerated: Well Instructions Provided: Adjustment of device, Care of device  Kizzie Fantasia 05/23/2023, 9:31 AM

## 2023-05-23 NOTE — Discharge Instructions (Signed)
DR. Susa Simmonds FOOT & ANKLE SURGERY POST-OP INSTRUCTIONS   Pain Management The numbing medicine and your leg will last around 18 hours, take a dose of your pain medicine as soon as you feel it wearing off to avoid rebound pain. Keep your foot elevated above heart level.  Make sure that your heel hangs free ('floats'). Take all prescribed medication as directed. If taking narcotic pain medication you may want to use an over-the-counter stool softener to avoid constipation. You may take over-the-counter NSAIDs (ibuprofen, naproxen, etc.) as well as over-the-counter acetaminophen as directed on the packaging as a supplement for your pain and may also use it to wean away from the prescription medication.  Activity Heel WB in post operative shoe. Keep dressing in place  First Postoperative Visit Your first postop visit will be at least 2 weeks after surgery.  This should be scheduled when you schedule surgery. If you do not have a postoperative visit scheduled please call [phone] to schedule an appointment. At the appointment your incision will be evaluated for suture removal, x-rays will be obtained if necessary.  General Instructions Swelling is very common after foot and ankle surgery.  It often takes 3 months for the foot and ankle to begin to feel comfortable.  Some amount of swelling will persist for 6-12 months. DO NOT change the dressing.  If there is a problem with the dressing (too tight, loose, gets wet, etc.) please contact Dr. Donnie Mesa office. DO NOT get the dressing wet.  For showers you can use an over-the-counter cast cover or wrap a washcloth around the top of your dressing and then cover it with a plastic bag and tape it to your leg. DO NOT soak the incision (no tubs, pools, bath, etc.) until you have approval from Dr. Susa Simmonds.  Contact Dr. Garret Reddish office or go to Emergency Room if: Temperature above 101 F. Increasing pain that is unresponsive to pain medication or  elevation Excessive redness or swelling in your foot Dressing problems - excessive bloody drainage, looseness or tightness, or if dressing gets wet Develop pain, swelling, warmth, or discoloration of your calf

## 2023-05-23 NOTE — Anesthesia Postprocedure Evaluation (Signed)
Anesthesia Post Note  Patient: Donald Osborne  Procedure(s) Performed: RIGHT HALLUS METATARSALPHALANGEAL JOINT (MTPJ) ARTHRODESIS (Right: Toe)     Patient location during evaluation: PACU Anesthesia Type: General Level of consciousness: awake and alert Pain management: pain level controlled Vital Signs Assessment: post-procedure vital signs reviewed and stable Respiratory status: spontaneous breathing, nonlabored ventilation, respiratory function stable and patient connected to nasal cannula oxygen Cardiovascular status: blood pressure returned to baseline and stable Postop Assessment: no apparent nausea or vomiting Anesthetic complications: no  No notable events documented.  Last Vitals:  Vitals:   05/23/23 0945 05/23/23 1000  BP: 115/81 125/84  Pulse: 61 62  Resp: 13 15  Temp:  36.7 C  SpO2: 94% 96%    Last Pain:  Vitals:   05/23/23 1145  TempSrc:   PainSc: 0-No pain                 Kennieth Rad

## 2023-05-23 NOTE — Anesthesia Procedure Notes (Addendum)
Anesthesia Regional Block: Popliteal block   Pre-Anesthetic Checklist: , timeout performed,  Correct Patient, Correct Site, Correct Laterality,  Correct Procedure, Correct Position, site marked,  Risks and benefits discussed,  Surgical consent,  Pre-op evaluation,  At surgeon's request and post-op pain management  Laterality: Right  Prep: chloraprep       Needles:  Injection technique: Single-shot  Needle Type: Echogenic Needle     Needle Length: 9cm  Needle Gauge: 21     Additional Needles:   Procedures:,,,, ultrasound used (permanent image in chart),,    Narrative:  Start time: 05/23/2023 6:55 AM End time: 05/23/2023 7:02 AM Injection made incrementally with aspirations every 5 mL.  Performed by: Personally  Anesthesiologist: Marcene Duos, MD

## 2023-05-23 NOTE — Anesthesia Procedure Notes (Signed)
Procedure Name: LMA Insertion Date/Time: 05/23/2023 7:29 AM  Performed by: Loleta Quanta Roher, CRNAPre-anesthesia Checklist: Patient identified, Patient being monitored, Timeout performed, Emergency Drugs available and Suction available Patient Re-evaluated:Patient Re-evaluated prior to induction Oxygen Delivery Method: Circle system utilized Preoxygenation: Pre-oxygenation with 100% oxygen Induction Type: IV induction Ventilation: Mask ventilation without difficulty LMA: LMA inserted LMA Size: 4.0 Tube type: Oral Number of attempts: 2 (1st attempt with #5. Small mouth opening) Placement Confirmation: positive ETCO2 and breath sounds checked- equal and bilateral Tube secured with: Tape Dental Injury: Teeth and Oropharynx as per pre-operative assessment

## 2023-05-23 NOTE — Anesthesia Procedure Notes (Signed)
Anesthesia Regional Block: Adductor canal block   Pre-Anesthetic Checklist: , timeout performed,  Correct Patient, Correct Site, Correct Laterality,  Correct Procedure, Correct Position, site marked,  Risks and benefits discussed,  Surgical consent,  Pre-op evaluation,  At surgeon's request and post-op pain management  Laterality: Right  Prep: chloraprep       Needles:  Injection technique: Single-shot  Needle Type: Echogenic Needle     Needle Length: 9cm  Needle Gauge: 21     Additional Needles:   Procedures:,,,, ultrasound used (permanent image in chart),,    Narrative:  Start time: 05/23/2023 7:02 AM End time: 05/23/2023 7:07 AM Injection made incrementally with aspirations every 5 mL.  Performed by: Personally  Anesthesiologist: Marcene Duos, MD

## 2023-05-23 NOTE — Transfer of Care (Signed)
Immediate Anesthesia Transfer of Care Note  Patient: Donald Osborne  Procedure(s) Performed: RIGHT HALLUS METATARSALPHALANGEAL JOINT (MTPJ) ARTHRODESIS (Right: Toe)  Patient Location: PACU  Anesthesia Type:General  Level of Consciousness: awake  Airway & Oxygen Therapy: Patient Spontanous Breathing  Post-op Assessment: Report given to RN and Post -op Vital signs reviewed and stable  Post vital signs: Reviewed and stable  Last Vitals:  Vitals Value Taken Time  BP 126/81 05/23/23 0908  Temp    Pulse 65   Resp 12 05/23/23 0909  SpO2 99   Vitals shown include unfiled device data.  Last Pain:  Vitals:   05/23/23 0539  TempSrc: Oral  PainSc: 0-No pain         Complications: No notable events documented.

## 2023-05-25 ENCOUNTER — Encounter (HOSPITAL_COMMUNITY): Payer: Self-pay | Admitting: Orthopaedic Surgery

## 2023-05-29 NOTE — Op Note (Signed)
 Donald Osborne male 63 y.o. 05/23/2023  PreOperative Diagnosis: Right foot first MTP arthritis with bunion deformity  PostOperative Diagnosis: same  PROCEDURE: Right foot first MTP arthrodesis  SURGEON: Dub Mikes, MD  ASSISTANT: Jesse [place], PA-C was necessary for patient positioning, prep, drape, assistance with surgery and placement of hardware.  ANESTHESIA: General with nerve block  FINDINGS: First MTP arthrosis with bunion deformity  IMPLANTS: Arthrex first MTP arthrodesis plate  [id number]:63 y.o. malehad severe bunion deformity with arthrosis in the first MTP joint.  He failed conservative treatment and was indicated for surgery.  Given the magnitude of his deformity and arthrosis he was indicated for first MTP fusion.   Patient understood the risks, benefits and alternatives to surgery which include but are not limited to wound healing complications, infection, nonunion, malunion, need for further surgery as well as damage to surrounding structures. They also understood the potential for continued pain in that there were no guarantees of acceptable outcome After weighing these risks the patient opted to proceed with surgery.  PROCEDURE:   Patient was identified in the preoperative holding area.  The right foot was marked by myself.  Consent was signed by myself and the patient. Peripheral nerve block was performed by anesthesia.Patient was taken to the operative suite and placed supine on the operative table.  General LMA anesthesia was induced without difficulty. Bump was placed under the operative thigh bone foam was used.  Preoperative antibiotics were given.  Leg was prepped and draped in the usual sterile fashion.  Surgical timeout was performed.  A 4 inch Esmarch tourniquet was placed around the right ankle.  I began by making a longitudinal incision overlying the first MTP joint.  This was done through skin and subcutaneous tissue.  Then blunt dissection was  used to mobilize soft tissue.  Soft tissue was dissected bluntly using spreading technique with scissors.  The extensor hallucis longus tendon was identified and incision was made medial to this.  Care was taken to protect the tendon.  The tendon was then retracted.  The joint capsule overlying the first MTP joint was identified and incised in line with the joint.  The joint capsular tissue was mobilized dorsally about the joint as well as medially and laterally to mobilize the joint further.  The joint was inspected.  There is cartilage wear and evidence of bony erosions along the medial eminence. Rongeur was used to remove the bony exostosis on the dorsal medial aspect of the first MTP joint.  Once the joint was fully mobilized K wire was placed within the first metatarsal and the cup and cone reamer was used to remove remaining cartilage surfaces.   After denuding cartilage and exposed cancellus bone was obtained the toe was reduced under manual manipulation.   Then the first MTP joint was reduced under direct manipulation.  This was held provisionally with K wire.  Then fluoroscopy confirmed appropriate reduction of the first MTP joint and flat plate was used to confirm appropriate sagittal plane position of the first MTP joint.  A partially-threaded cannulated screw was placed from distal to proximal across the joint for lag fixation. Then a dorsal locking plate was placed across the first MTP joint with good compression and with a combination of locking and nonlocking screws.  The reduction was reassessed and found to be acceptable.  Final fluoroscopic images were obtained. Appropriate plate placement and screw length was confirmed.  Tourniquet was released.  Hemostasis was obtained.  Wound was irrigated with normal  saline.a small amount of vancomycin powder was placed within the wound.  The wound was then closed in layered fashion using 3-0 Monocryl and 3-0 nylon suture including the dorsal capsular  tissue.  Patient was placed in a soft dressing including Xeroform, 4 x 4's, sterile sheet cotton and a four-inch Ace wrap.  Patient was awakened from anesthesia and taken recovery in stable condition.  There were no complications.   POST OPERATIVE INSTRUCTIONS: Heel weightbearing in a postoperative shoe Follow-up in 2 weeks for suture removal and x-rays   TOURNIQUET TIME:less than one hour  BLOOD LOSS:  Minimal         DRAINS: none         SPECIMEN: none       COMPLICATIONS:  * No complications entered in OR log *         Disposition: PACU - hemodynamically stable.         Condition: stable

## 2024-02-13 DIAGNOSIS — F418 Other specified anxiety disorders: Secondary | ICD-10-CM | POA: Diagnosis not present

## 2024-02-13 DIAGNOSIS — E039 Hypothyroidism, unspecified: Secondary | ICD-10-CM | POA: Diagnosis not present

## 2024-02-13 DIAGNOSIS — E785 Hyperlipidemia, unspecified: Secondary | ICD-10-CM | POA: Diagnosis not present

## 2024-02-13 DIAGNOSIS — F900 Attention-deficit hyperactivity disorder, predominantly inattentive type: Secondary | ICD-10-CM | POA: Diagnosis not present

## 2024-02-13 DIAGNOSIS — I10 Essential (primary) hypertension: Secondary | ICD-10-CM | POA: Diagnosis not present

## 2024-03-12 DIAGNOSIS — M89319 Hypertrophy of bone, unspecified shoulder: Secondary | ICD-10-CM | POA: Diagnosis not present

## 2024-04-12 ENCOUNTER — Other Ambulatory Visit (HOSPITAL_BASED_OUTPATIENT_CLINIC_OR_DEPARTMENT_OTHER): Payer: Self-pay

## 2024-04-12 ENCOUNTER — Ambulatory Visit (HOSPITAL_BASED_OUTPATIENT_CLINIC_OR_DEPARTMENT_OTHER): Admitting: Orthopaedic Surgery

## 2024-04-12 DIAGNOSIS — M25512 Pain in left shoulder: Secondary | ICD-10-CM | POA: Diagnosis not present

## 2024-04-12 DIAGNOSIS — M25511 Pain in right shoulder: Secondary | ICD-10-CM

## 2024-04-12 MED ORDER — TRIAMCINOLONE ACETONIDE 40 MG/ML IJ SUSP
80.0000 mg | INTRAMUSCULAR | Status: AC | PRN
  Administered 2024-04-12: 80 mg via INTRA_ARTICULAR

## 2024-04-12 MED ORDER — LIDOCAINE HCL 1 % IJ SOLN
4.0000 mL | INTRAMUSCULAR | Status: AC | PRN
  Administered 2024-04-12: 4 mL
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
